# Patient Record
Sex: Female | Born: 1950 | Race: White | Hispanic: No | Smoking: Never smoker
Health system: Southern US, Community
[De-identification: ages and names within clinical notes are randomized; demographics above are authoritative.]

## PROBLEM LIST (undated history)

## (undated) DIAGNOSIS — E7800 Pure hypercholesterolemia, unspecified: Secondary | ICD-10-CM

## (undated) DIAGNOSIS — G2581 Restless legs syndrome: Secondary | ICD-10-CM

## (undated) HISTORY — PX: NECK EXPLORATION: SHX2077

## (undated) HISTORY — DX: Pure hypercholesterolemia, unspecified: E78.00

---

## 2018-04-13 ENCOUNTER — Emergency Department (HOSPITAL_COMMUNITY): Payer: BLUE CROSS/BLUE SHIELD

## 2018-04-13 ENCOUNTER — Emergency Department (HOSPITAL_COMMUNITY)
Admission: EM | Admit: 2018-04-13 | Discharge: 2018-04-13 | Disposition: A | Discharge status: Home / Self Care | Payer: BLUE CROSS/BLUE SHIELD | Attending: Emergency Medicine | Admitting: Emergency Medicine

## 2018-04-13 ENCOUNTER — Encounter (HOSPITAL_COMMUNITY): Payer: Self-pay

## 2018-04-13 ENCOUNTER — Other Ambulatory Visit: Payer: Self-pay

## 2018-04-13 DIAGNOSIS — Y9389 Activity, other specified: Secondary | ICD-10-CM | POA: Diagnosis not present

## 2018-04-13 DIAGNOSIS — Y998 Other external cause status: Secondary | ICD-10-CM | POA: Diagnosis not present

## 2018-04-13 DIAGNOSIS — W25XXXA Contact with sharp glass, initial encounter: Secondary | ICD-10-CM | POA: Insufficient documentation

## 2018-04-13 DIAGNOSIS — S61411A Laceration without foreign body of right hand, initial encounter: Secondary | ICD-10-CM | POA: Diagnosis present

## 2018-04-13 DIAGNOSIS — Y9289 Other specified places as the place of occurrence of the external cause: Secondary | ICD-10-CM | POA: Diagnosis not present

## 2018-04-13 IMAGING — CR DG HAND COMPLETE 3+V*R*
3 series · 3 of 3 positions shown · non-contrast
Comparison: None.

CLINICAL DATA: The patient suffered a laceration of the right hand
proximal to the little finger while trying to glue a plate together
today. Initial encounter.

EXAM:
RIGHT HAND - COMPLETE 3+ VIEW

[x hand pa right]
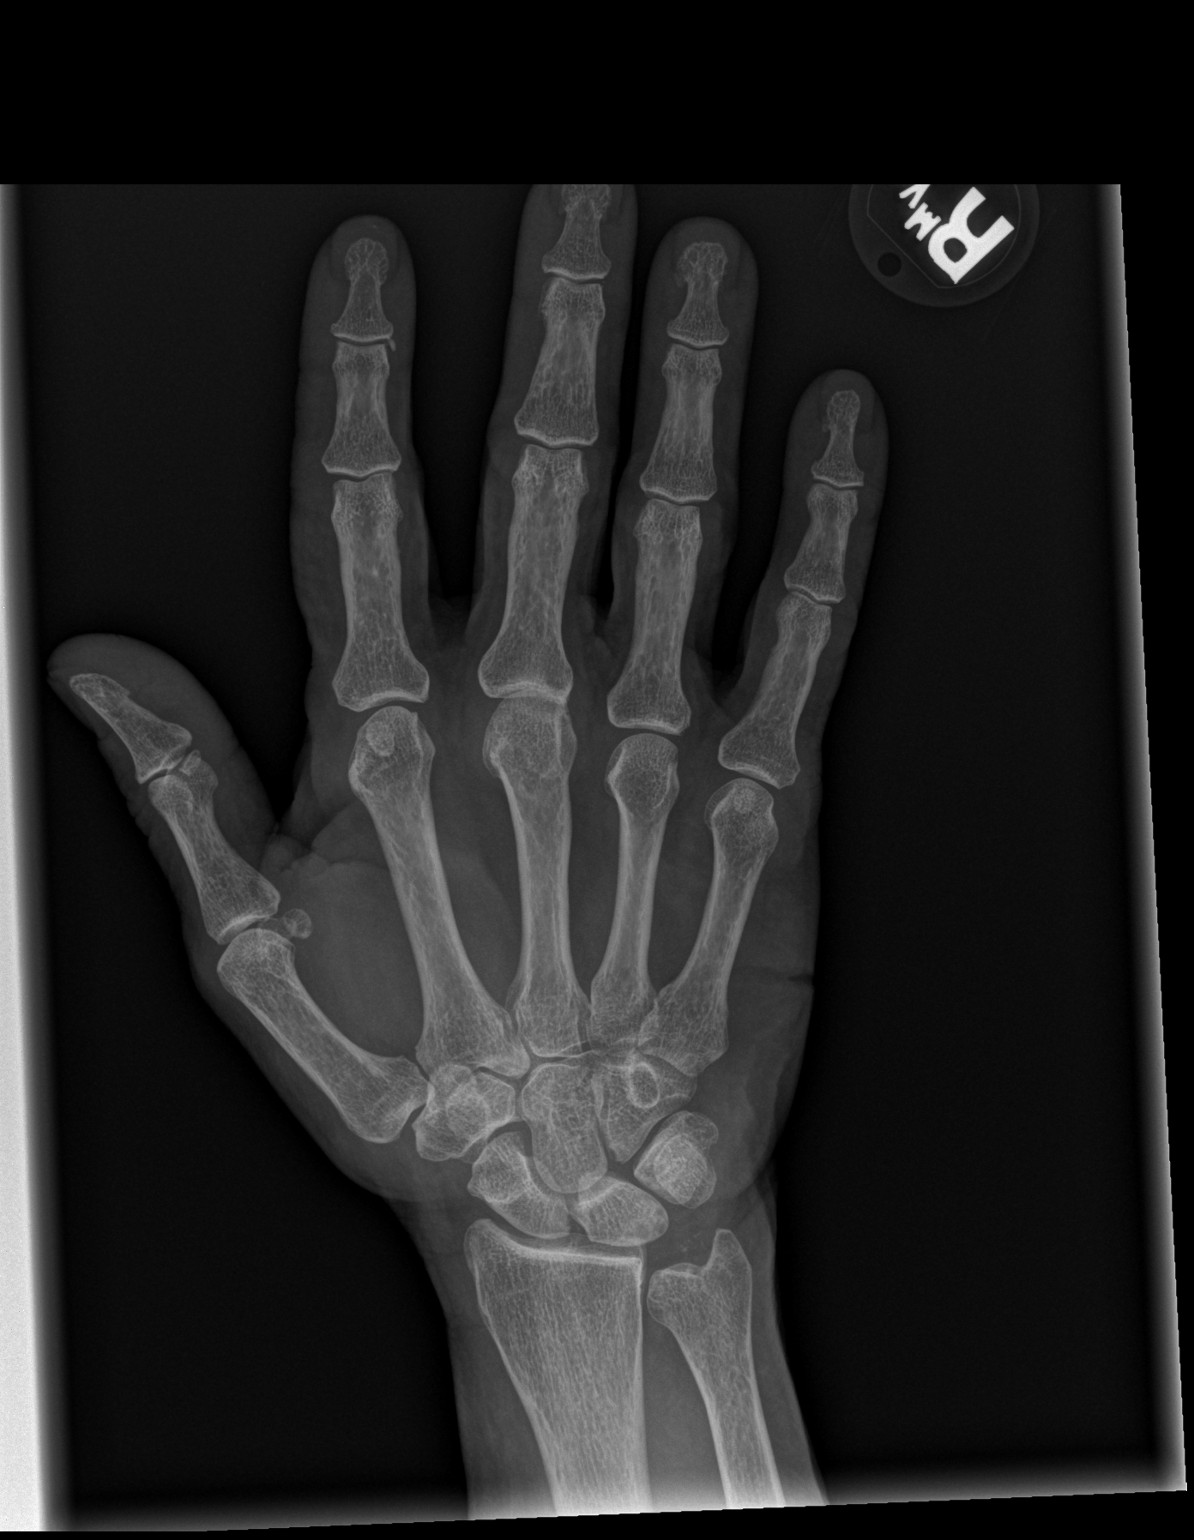

[x hand obl right]
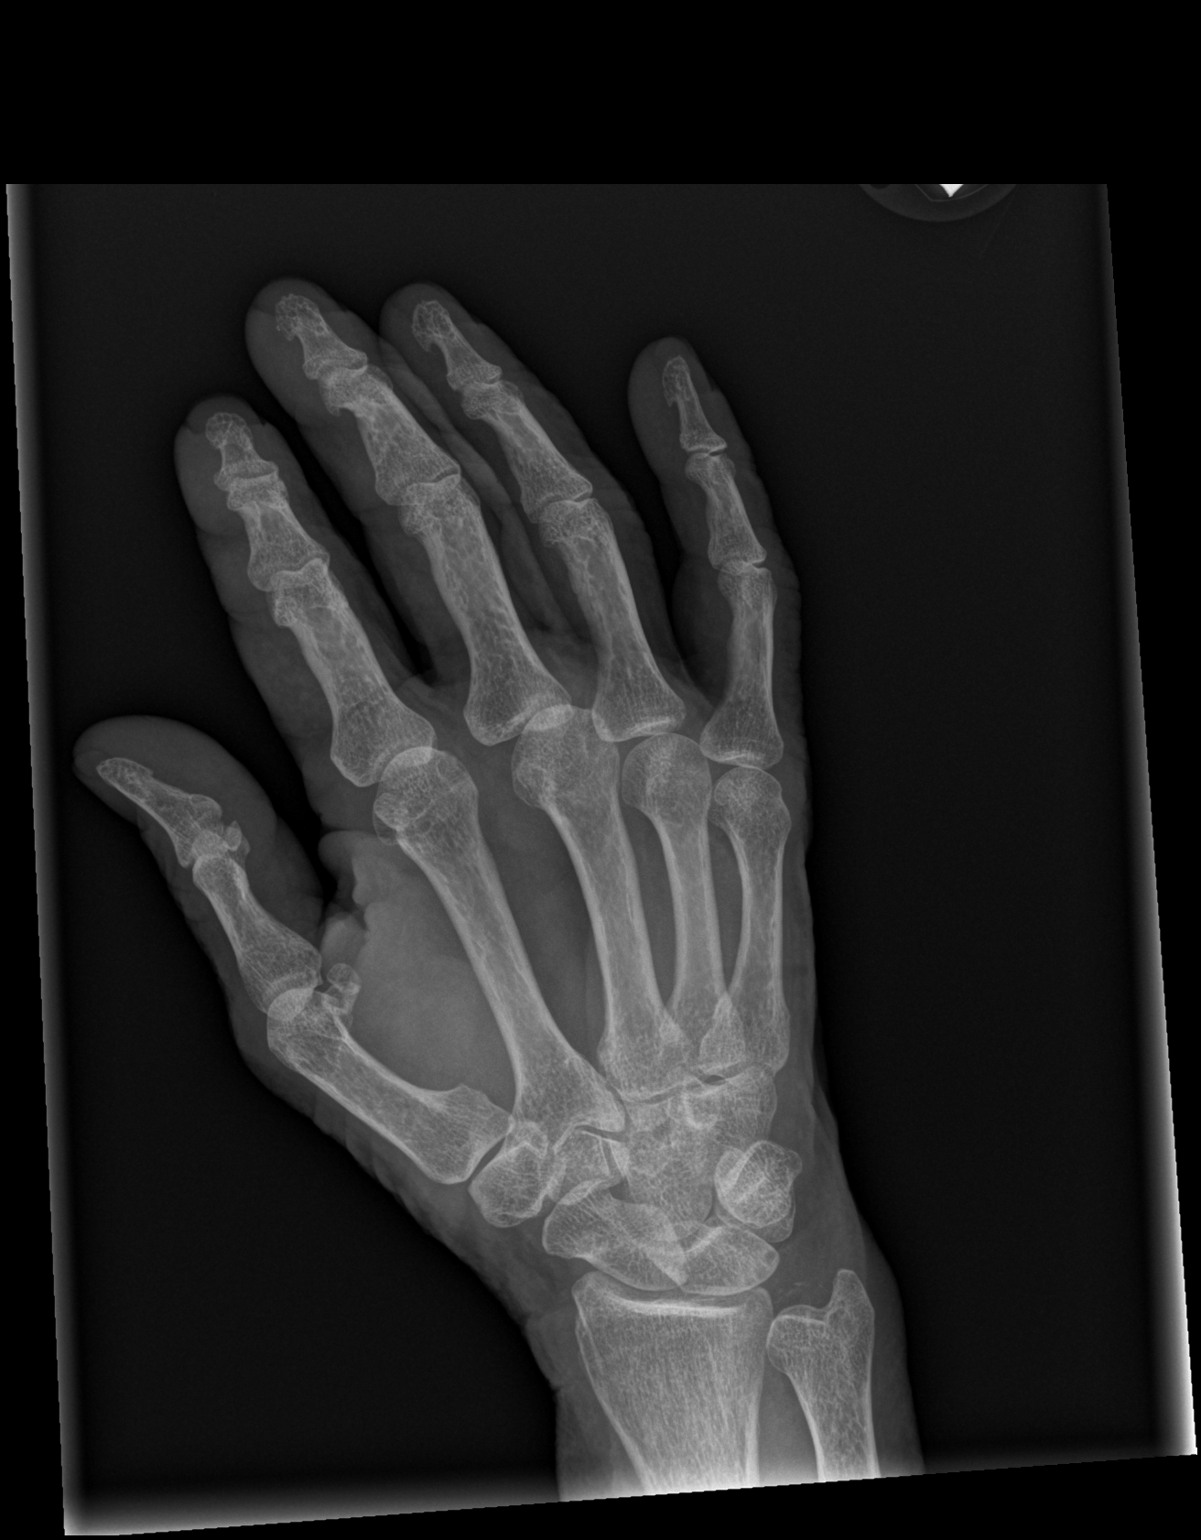

[x hand lat right]
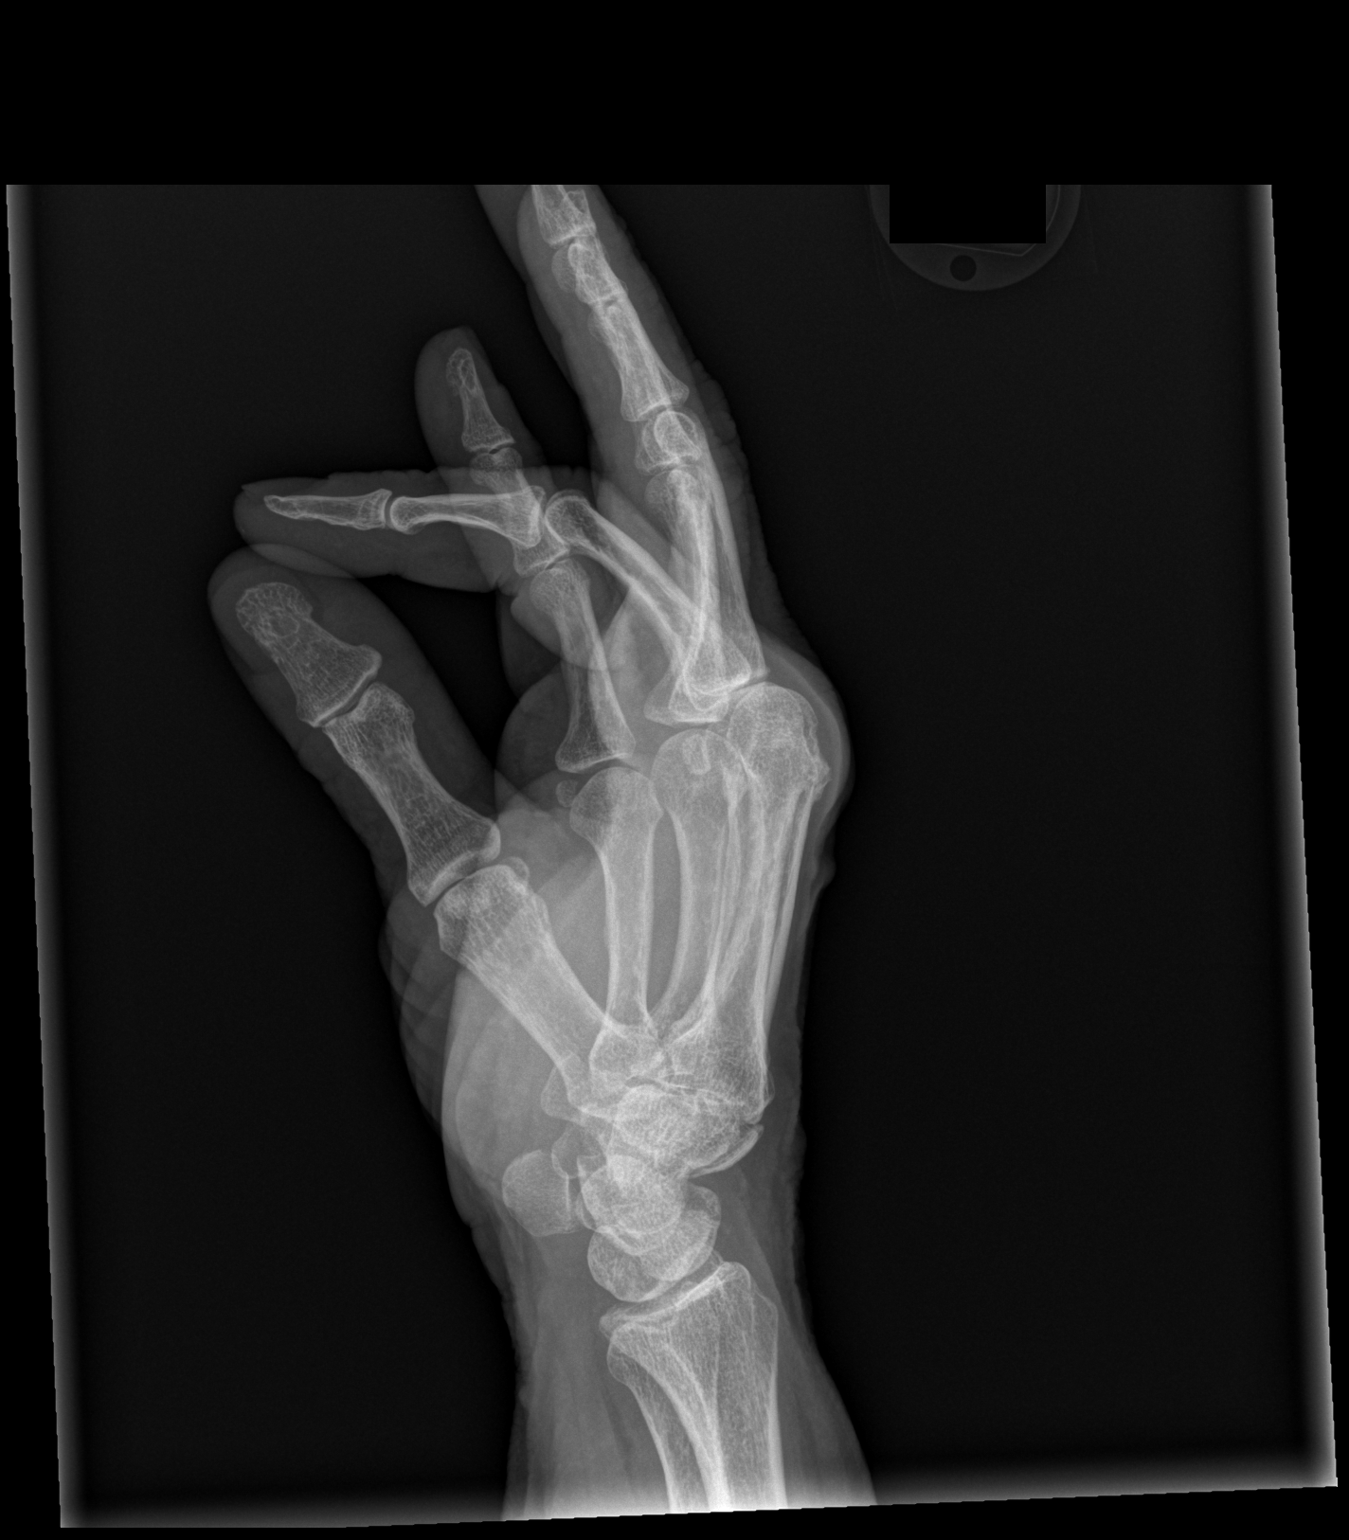

[3 of 3 positions shown; findings below may reference images not displayed]

FINDINGS: Skin defect along the medial margin of the diaphysis of the fifth
metacarpal is compatible with laceration. No fracture or foreign
body. No evidence of arthropathy. Mild chondrocalcinosis noted.
IMPRESSION: Laceration without underlying fracture or foreign body.

## 2018-04-13 MED ORDER — BACITRACIN ZINC 500 UNIT/GM EX OINT
TOPICAL_OINTMENT | Freq: Once | CUTANEOUS | Status: AC
  Administered 2018-04-13: 1 via TOPICAL
  Filled 2018-04-13: qty 1.8

## 2018-04-13 MED ORDER — CEPHALEXIN 500 MG PO CAPS: 500.0000 mg | ORAL_CAPSULE | Freq: Four times a day (QID) | ORAL | 0 refills | Status: AC

## 2018-04-13 MED ORDER — LIDOCAINE HCL (PF) 1 % IJ SOLN
10.0000 mL | Freq: Once | INTRAMUSCULAR | Status: AC
  Administered 2018-04-13: 10 mL via INTRADERMAL
  Filled 2018-04-13: qty 30

## 2018-04-13 NOTE — ED Triage Notes (Signed)
Patient states she was trying to glue a plate back together and cut the palm of her right hand. Bleeding controlled.

## 2018-04-13 NOTE — ED Provider Notes (Signed)
[facility] DEPT Provider Note   CSN: [id number] Arrival date & time: 04/13/18  1637     History   Chief Complaint Chief Complaint  Patient presents with  . Extremity Laceration    HPI Bonnie Robinson is a 67 y.o. female who presents for evaluation of laceration to the palm of her right hand that occurred about 1 hour prior to arrival.  She states she was trying to clean the plate and the plate broke, slicing the lateral aspect of her right palm.  She states her last tetanus was about a year ago.  She is not currently on blood thinners.  She denies any numbness/weakness.  The history is provided by the patient.    History reviewed. No pertinent past medical history.  There are no active problems to display for this patient.   History reviewed. No pertinent surgical history.   OB History   None      Home Medications    Prior to Admission medications   Medication Sig Start Date End Date Taking? Authorizing Provider  cephALEXin (KEFLEX) 500 MG capsule Take 1 capsule (500 mg total) by mouth 4 (four) times daily for 7 days. 04/13/18 04/20/18  Volanda Napoleon, PA-C    Family History Family History  Problem Relation Age of Onset  . Cancer Mother   . Heart failure Father     Social History Social History   Tobacco Use  . Smoking status: Never Smoker  . Smokeless tobacco: Never Used  Substance Use Topics  . Alcohol use: Yes    Comment: occasionally  . Drug use: Never     Allergies   Sulfa antibiotics   Review of Systems Review of Systems  Skin: Positive for wound.  Neurological: Negative for weakness and numbness.  All other systems reviewed and are negative.    Physical Exam Updated Vital Signs BP (!) 145/95 (BP Location: Left Arm)   Pulse 70   Temp 98.7 F (37.1 C) (Oral)   Resp 16   Ht 5\' 3"  (1.6 m)   Wt 74.8 kg   SpO2 100%   BMI 29.23 kg/m   Physical Exam  Constitutional: She appears well-developed and  well-nourished.  HENT:  Head: Normocephalic and atraumatic.  Eyes: Conjunctivae and EOM are normal. Right eye exhibits no discharge. Left eye exhibits no discharge. No scleral icterus.  Cardiovascular:  Pulses:      Radial pulses are 2+ on the right side, and 2+ on the left side.  Pulmonary/Chest: Effort normal.  Musculoskeletal:  No tenderness palpation noted to the carpal bones of the right hand.  No deformity or crepitus noted.  She has full flexion/extension of all 5 digits intact any difficulty.  She can easily make a fist.  Full flexion-extension of left pinky intact without any difficulty.  Neurological: She is alert.  Skin: Skin is warm and dry. Capillary refill takes less than 2 seconds.  Good distal cap refill.  RUE is not dusky in appearance or cool to touch. 3 cm curvilinear laceration noted to the mid hyperthenar eminence of the right palm.  Psychiatric: She has a normal mood and affect. Her speech is normal and behavior is normal.  Nursing note and vitals reviewed.    ED Treatments / Results  Labs (all labs ordered are listed, but only abnormal results are displayed) Labs Reviewed - No data to display  EKG None  Radiology Dg Hand Complete Right  Result Date: 04/13/2018 CLINICAL DATA:  The  patient suffered a laceration of the right hand proximal to the little finger while trying to glue a plate together today. Initial encounter. EXAM: RIGHT HAND - COMPLETE 3+ VIEW COMPARISON:  None. FINDINGS: Skin defect along the medial margin of the diaphysis of the fifth metacarpal is compatible with laceration. No fracture or foreign body. No evidence of arthropathy. Mild chondrocalcinosis noted. IMPRESSION: Laceration without underlying fracture or foreign body. Electronically Signed   By: Inge Rise M.D.   On: 04/13/2018 18:06    Procedures .Marland KitchenLaceration Repair Date/Time: 04/13/2018 5:23 PM Performed by: Volanda Napoleon, PA-C Authorized by: Volanda Napoleon, PA-C    Consent:    Consent obtained:  Verbal   Consent given by:  Patient   Risks discussed:  Infection, need for additional repair, pain, poor cosmetic result and poor wound healing   Alternatives discussed:  No treatment and delayed treatment Universal protocol:    Procedure explained and questions answered to patient or proxy's satisfaction: yes     Relevant documents present and verified: yes     Test results available and properly labeled: yes     Imaging studies available: yes     Required blood products, implants, devices, and special equipment available: yes     Site/side marked: yes     Immediately prior to procedure, a time out was called: yes     Patient identity confirmed:  Verbally with patient Anesthesia (see MAR for exact dosages):    Anesthesia method:  Local infiltration   Local anesthetic:  Lidocaine 1% w/o epi Laceration details:    Location:  Hand   Hand location:  R palm   Length (cm):  3 Repair type:    Repair type:  Simple Pre-procedure details:    Preparation:  Patient was prepped and draped in usual sterile fashion Exploration:    Hemostasis achieved with:  Direct pressure   Wound exploration: wound explored through full range of motion     Wound extent: no foreign bodies/material noted and no tendon damage noted     Contaminated: no   Treatment:    Area cleansed with:  Betadine   Amount of cleaning:  Extensive   Irrigation solution:  Sterile saline   Irrigation method:  Syringe Mucous membrane repair:    Suture size:  5-0   Suture material:  Vicryl   Suture technique:  Simple interrupted   Number of sutures:  1 Skin repair:    Repair method:  Sutures   Suture size:  4-0   Suture material:  Nylon   Suture technique:  Simple interrupted   Number of sutures:  7 Approximation:    Approximation:  Close Post-procedure details:    Dressing:  Antibiotic ointment and non-adherent dressing Comments:     Once the wound was anesthetized, it was thoroughly  and extensively explored.  No evidence of tendon injury.  She is able to fully flex and extend the right fifth digit without any difficulty.  She did have what looked like a small area of pulsatile bleeding from a small capillary.  This area easily stop bleeding with direct pressure.  The wound was thoroughly and extensively explored with no evidence of foreign body.  It was thoroughly and extensively irrigated with sterile saline.   (including critical care time)  Medications Ordered in ED Medications  lidocaine (PF) (XYLOCAINE) 1 % injection 10 mL (10 mLs Intradermal Given 04/13/18 1800)  bacitracin ointment (1 application Topical Given 04/13/18 1908)     Initial  Impression / Assessment and Plan / ED Course  I have reviewed the triage vital signs and the nursing notes.  Pertinent labs & imaging results that were available during my care of the patient were reviewed by me and considered in my medical decision making (see chart for details).     67 year old 61-year-old female who presents for evaluation of right hand laceration that occurred while she was washing dishes.  Her tetanus is up-to-date. Patient is afebrile, non-toxic appearing, sitting comfortably on examination table. Vital signs reviewed and stable.  Patient is neurovascularly intact.  On exam, she has a 3 similar curvilinear linear laceration that extends from the volar aspect of the hyperthenar eminence around the lateral aspect.  She has full flexion/extension of all fingers without any difficulty.  Do not suspect tendon injury.  Will obtain x-ray for evaluation of any foreign body versus fracture.  X-ray reviewed.  Negative for any fracture or foreign body.  Laceration repaired as documented above.  Patient tolerated procedure without any difficulty.  Encouraged at home supportive care measures. Patient had ample opportunity for questions and discussion. All patient's questions were answered with full understanding. Strict  return precautions discussed. Patient expresses understanding and agreement to plan.   Final Clinical Impressions(s) / ED Diagnoses   Final diagnoses:  Laceration of right hand without foreign body, initial encounter    ED Discharge Orders         Ordered    cephALEXin (KEFLEX) 500 MG capsule  4 times daily     04/13/18 1850           Desma Mcgregor 04/13/18 2110    Quintella Reichert, MD 04/14/18 0003

## 2018-04-13 NOTE — Discharge Instructions (Addendum)
Keep the wound clean and dry for the first 24 hours. After that you may gently clean the wound with soap and water. Make sure to pat dry the wound before covering it with any dressing. You can use topical antibiotic ointment and bandage. Ice and elevate for pain relief.  ° °You can take Tylenol or Ibuprofen as directed for pain. You can alternate Tylenol and Ibuprofen every 4 hours for additional pain relief.  ° °Return to the Emergency Department, your primary care doctor, or the [facility] in 5-7 days for suture removal.  ° °Monitor closely for any signs of infection. Return to the Emergency Department for any worsening redness/swelling of the area that begins to spread, drainage from the site, worsening pain, fever or any other worsening or concerning symptoms.  ° ° °

## 2018-06-26 DIAGNOSIS — Z1159 Encounter for screening for other viral diseases: Secondary | ICD-10-CM | POA: Diagnosis not present

## 2018-06-26 DIAGNOSIS — Z0000 Encounter for general adult medical examination without abnormal findings: Secondary | ICD-10-CM | POA: Diagnosis not present

## 2018-06-28 DIAGNOSIS — G2581 Restless legs syndrome: Secondary | ICD-10-CM | POA: Diagnosis not present

## 2018-06-28 DIAGNOSIS — Z1159 Encounter for screening for other viral diseases: Secondary | ICD-10-CM | POA: Diagnosis not present

## 2018-06-28 DIAGNOSIS — Z0000 Encounter for general adult medical examination without abnormal findings: Secondary | ICD-10-CM | POA: Diagnosis not present

## 2018-06-28 DIAGNOSIS — Z1211 Encounter for screening for malignant neoplasm of colon: Secondary | ICD-10-CM | POA: Diagnosis not present

## 2018-06-28 DIAGNOSIS — D72819 Decreased white blood cell count, unspecified: Secondary | ICD-10-CM | POA: Diagnosis not present

## 2018-06-28 DIAGNOSIS — E669 Obesity, unspecified: Secondary | ICD-10-CM | POA: Diagnosis not present

## 2018-06-28 DIAGNOSIS — Z23 Encounter for immunization: Secondary | ICD-10-CM | POA: Diagnosis not present

## 2018-06-28 DIAGNOSIS — M8589 Other specified disorders of bone density and structure, multiple sites: Secondary | ICD-10-CM | POA: Diagnosis not present

## 2018-06-28 DIAGNOSIS — E785 Hyperlipidemia, unspecified: Secondary | ICD-10-CM | POA: Diagnosis not present

## 2018-07-06 DIAGNOSIS — Z1239 Encounter for other screening for malignant neoplasm of breast: Secondary | ICD-10-CM | POA: Diagnosis not present

## 2018-07-06 DIAGNOSIS — Z1231 Encounter for screening mammogram for malignant neoplasm of breast: Secondary | ICD-10-CM | POA: Diagnosis not present

## 2018-09-25 DIAGNOSIS — D72819 Decreased white blood cell count, unspecified: Secondary | ICD-10-CM | POA: Diagnosis not present

## 2018-09-25 DIAGNOSIS — E785 Hyperlipidemia, unspecified: Secondary | ICD-10-CM | POA: Diagnosis not present

## 2018-09-27 DIAGNOSIS — E669 Obesity, unspecified: Secondary | ICD-10-CM | POA: Diagnosis not present

## 2018-09-27 DIAGNOSIS — D72819 Decreased white blood cell count, unspecified: Secondary | ICD-10-CM | POA: Diagnosis not present

## 2018-09-27 DIAGNOSIS — E785 Hyperlipidemia, unspecified: Secondary | ICD-10-CM | POA: Diagnosis not present

## 2018-11-13 DIAGNOSIS — Z1211 Encounter for screening for malignant neoplasm of colon: Secondary | ICD-10-CM | POA: Diagnosis not present

## 2018-11-13 DIAGNOSIS — K5730 Diverticulosis of large intestine without perforation or abscess without bleeding: Secondary | ICD-10-CM | POA: Diagnosis not present

## 2018-11-13 DIAGNOSIS — K640 First degree hemorrhoids: Secondary | ICD-10-CM | POA: Diagnosis not present

## 2018-11-13 DIAGNOSIS — Z86010 Personal history of colonic polyps: Secondary | ICD-10-CM | POA: Diagnosis not present

## 2018-11-13 DIAGNOSIS — K648 Other hemorrhoids: Secondary | ICD-10-CM | POA: Diagnosis not present

## 2018-12-24 DIAGNOSIS — Z124 Encounter for screening for malignant neoplasm of cervix: Secondary | ICD-10-CM | POA: Diagnosis not present

## 2018-12-24 DIAGNOSIS — Z01419 Encounter for gynecological examination (general) (routine) without abnormal findings: Secondary | ICD-10-CM | POA: Diagnosis not present

## 2019-07-01 DIAGNOSIS — M25551 Pain in right hip: Secondary | ICD-10-CM | POA: Diagnosis not present

## 2019-07-01 DIAGNOSIS — D649 Anemia, unspecified: Secondary | ICD-10-CM | POA: Diagnosis not present

## 2019-07-01 DIAGNOSIS — Z0000 Encounter for general adult medical examination without abnormal findings: Secondary | ICD-10-CM | POA: Diagnosis not present

## 2019-07-01 DIAGNOSIS — E785 Hyperlipidemia, unspecified: Secondary | ICD-10-CM | POA: Diagnosis not present

## 2019-07-01 DIAGNOSIS — D72819 Decreased white blood cell count, unspecified: Secondary | ICD-10-CM | POA: Diagnosis not present

## 2019-07-01 DIAGNOSIS — G8929 Other chronic pain: Secondary | ICD-10-CM | POA: Diagnosis not present

## 2019-07-01 DIAGNOSIS — Z135 Encounter for screening for eye and ear disorders: Secondary | ICD-10-CM | POA: Diagnosis not present

## 2019-07-01 DIAGNOSIS — G2581 Restless legs syndrome: Secondary | ICD-10-CM | POA: Diagnosis not present

## 2019-07-02 DIAGNOSIS — E538 Deficiency of other specified B group vitamins: Secondary | ICD-10-CM | POA: Diagnosis not present

## 2019-07-03 DIAGNOSIS — E538 Deficiency of other specified B group vitamins: Secondary | ICD-10-CM | POA: Diagnosis not present

## 2019-07-05 DIAGNOSIS — E538 Deficiency of other specified B group vitamins: Secondary | ICD-10-CM | POA: Diagnosis not present

## 2019-07-06 DIAGNOSIS — E538 Deficiency of other specified B group vitamins: Secondary | ICD-10-CM | POA: Diagnosis not present

## 2019-07-07 DIAGNOSIS — E538 Deficiency of other specified B group vitamins: Secondary | ICD-10-CM | POA: Insufficient documentation

## 2019-07-07 HISTORY — DX: Deficiency of other specified B group vitamins: E53.8

## 2019-07-08 DIAGNOSIS — E538 Deficiency of other specified B group vitamins: Secondary | ICD-10-CM | POA: Diagnosis not present

## 2019-07-09 DIAGNOSIS — E538 Deficiency of other specified B group vitamins: Secondary | ICD-10-CM | POA: Diagnosis not present

## 2019-07-15 DIAGNOSIS — Z83511 Family history of glaucoma: Secondary | ICD-10-CM | POA: Diagnosis not present

## 2019-07-15 DIAGNOSIS — H2513 Age-related nuclear cataract, bilateral: Secondary | ICD-10-CM | POA: Diagnosis not present

## 2019-07-15 DIAGNOSIS — H5203 Hypermetropia, bilateral: Secondary | ICD-10-CM | POA: Diagnosis not present

## 2019-07-15 DIAGNOSIS — H52203 Unspecified astigmatism, bilateral: Secondary | ICD-10-CM | POA: Diagnosis not present

## 2019-07-15 DIAGNOSIS — H524 Presbyopia: Secondary | ICD-10-CM | POA: Diagnosis not present

## 2019-07-15 DIAGNOSIS — H43393 Other vitreous opacities, bilateral: Secondary | ICD-10-CM | POA: Diagnosis not present

## 2019-07-18 DIAGNOSIS — M25551 Pain in right hip: Secondary | ICD-10-CM | POA: Diagnosis not present

## 2019-07-18 DIAGNOSIS — G8929 Other chronic pain: Secondary | ICD-10-CM | POA: Diagnosis not present

## 2019-07-26 DIAGNOSIS — E538 Deficiency of other specified B group vitamins: Secondary | ICD-10-CM | POA: Diagnosis not present

## 2019-07-29 DIAGNOSIS — M6281 Muscle weakness (generalized): Secondary | ICD-10-CM | POA: Diagnosis not present

## 2019-07-29 DIAGNOSIS — R52 Pain, unspecified: Secondary | ICD-10-CM | POA: Diagnosis not present

## 2019-07-29 DIAGNOSIS — M25551 Pain in right hip: Secondary | ICD-10-CM | POA: Diagnosis not present

## 2019-07-29 DIAGNOSIS — Z789 Other specified health status: Secondary | ICD-10-CM | POA: Diagnosis not present

## 2019-07-29 DIAGNOSIS — G8929 Other chronic pain: Secondary | ICD-10-CM | POA: Diagnosis not present

## 2019-08-02 DIAGNOSIS — E538 Deficiency of other specified B group vitamins: Secondary | ICD-10-CM | POA: Diagnosis not present

## 2019-08-27 DIAGNOSIS — Z1231 Encounter for screening mammogram for malignant neoplasm of breast: Secondary | ICD-10-CM | POA: Diagnosis not present

## 2019-09-12 DIAGNOSIS — E538 Deficiency of other specified B group vitamins: Secondary | ICD-10-CM | POA: Diagnosis not present

## 2020-01-07 DIAGNOSIS — D509 Iron deficiency anemia, unspecified: Secondary | ICD-10-CM | POA: Diagnosis not present

## 2020-01-07 DIAGNOSIS — E538 Deficiency of other specified B group vitamins: Secondary | ICD-10-CM | POA: Diagnosis not present

## 2020-01-31 DIAGNOSIS — D229 Melanocytic nevi, unspecified: Secondary | ICD-10-CM | POA: Diagnosis not present

## 2020-01-31 DIAGNOSIS — L821 Other seborrheic keratosis: Secondary | ICD-10-CM | POA: Diagnosis not present

## 2020-01-31 DIAGNOSIS — L814 Other melanin hyperpigmentation: Secondary | ICD-10-CM | POA: Diagnosis not present

## 2020-01-31 DIAGNOSIS — R239 Unspecified skin changes: Secondary | ICD-10-CM | POA: Diagnosis not present

## 2020-02-03 DIAGNOSIS — Z01419 Encounter for gynecological examination (general) (routine) without abnormal findings: Secondary | ICD-10-CM | POA: Diagnosis not present

## 2020-02-03 DIAGNOSIS — Z23 Encounter for immunization: Secondary | ICD-10-CM | POA: Diagnosis not present

## 2020-02-03 DIAGNOSIS — Z8041 Family history of malignant neoplasm of ovary: Secondary | ICD-10-CM

## 2020-02-03 DIAGNOSIS — Z1371 Encounter for nonprocreative screening for genetic disease carrier status: Secondary | ICD-10-CM | POA: Insufficient documentation

## 2020-02-03 HISTORY — DX: Encounter for nonprocreative screening for genetic disease carrier status: Z13.71

## 2020-02-03 HISTORY — DX: Family history of malignant neoplasm of ovary: Z80.41

## 2020-04-29 DIAGNOSIS — R519 Headache, unspecified: Secondary | ICD-10-CM | POA: Diagnosis not present

## 2020-04-29 DIAGNOSIS — D72819 Decreased white blood cell count, unspecified: Secondary | ICD-10-CM | POA: Diagnosis not present

## 2020-04-29 DIAGNOSIS — E539 Vitamin B deficiency, unspecified: Secondary | ICD-10-CM | POA: Diagnosis not present

## 2020-04-29 DIAGNOSIS — Z8639 Personal history of other endocrine, nutritional and metabolic disease: Secondary | ICD-10-CM | POA: Insufficient documentation

## 2020-04-29 DIAGNOSIS — E538 Deficiency of other specified B group vitamins: Secondary | ICD-10-CM | POA: Diagnosis not present

## 2020-04-29 DIAGNOSIS — Z79899 Other long term (current) drug therapy: Secondary | ICD-10-CM | POA: Diagnosis not present

## 2020-04-29 DIAGNOSIS — M542 Cervicalgia: Secondary | ICD-10-CM | POA: Diagnosis not present

## 2020-04-29 DIAGNOSIS — M47812 Spondylosis without myelopathy or radiculopathy, cervical region: Secondary | ICD-10-CM | POA: Diagnosis not present

## 2020-04-29 HISTORY — DX: Personal history of other endocrine, nutritional and metabolic disease: Z86.39

## 2020-05-05 DIAGNOSIS — M542 Cervicalgia: Secondary | ICD-10-CM | POA: Insufficient documentation

## 2020-05-05 HISTORY — DX: Cervicalgia: M54.2

## 2020-07-01 DIAGNOSIS — D509 Iron deficiency anemia, unspecified: Secondary | ICD-10-CM | POA: Diagnosis not present

## 2020-07-01 DIAGNOSIS — E785 Hyperlipidemia, unspecified: Secondary | ICD-10-CM | POA: Diagnosis not present

## 2020-07-02 DIAGNOSIS — R519 Headache, unspecified: Secondary | ICD-10-CM | POA: Diagnosis not present

## 2020-07-02 DIAGNOSIS — Z9189 Other specified personal risk factors, not elsewhere classified: Secondary | ICD-10-CM | POA: Diagnosis not present

## 2020-07-02 DIAGNOSIS — M542 Cervicalgia: Secondary | ICD-10-CM | POA: Diagnosis not present

## 2020-07-02 DIAGNOSIS — G2581 Restless legs syndrome: Secondary | ICD-10-CM | POA: Diagnosis not present

## 2020-07-02 DIAGNOSIS — Z13820 Encounter for screening for osteoporosis: Secondary | ICD-10-CM | POA: Diagnosis not present

## 2020-07-02 DIAGNOSIS — E785 Hyperlipidemia, unspecified: Secondary | ICD-10-CM | POA: Diagnosis not present

## 2020-07-02 DIAGNOSIS — Z0000 Encounter for general adult medical examination without abnormal findings: Secondary | ICD-10-CM | POA: Diagnosis not present

## 2020-07-02 DIAGNOSIS — Z23 Encounter for immunization: Secondary | ICD-10-CM | POA: Diagnosis not present

## 2020-07-02 DIAGNOSIS — D509 Iron deficiency anemia, unspecified: Secondary | ICD-10-CM | POA: Diagnosis not present

## 2020-07-02 DIAGNOSIS — R030 Elevated blood-pressure reading, without diagnosis of hypertension: Secondary | ICD-10-CM | POA: Diagnosis not present

## 2020-07-02 DIAGNOSIS — D72819 Decreased white blood cell count, unspecified: Secondary | ICD-10-CM | POA: Diagnosis not present

## 2020-07-06 DIAGNOSIS — Z780 Asymptomatic menopausal state: Secondary | ICD-10-CM | POA: Diagnosis not present

## 2020-07-06 DIAGNOSIS — M85832 Other specified disorders of bone density and structure, left forearm: Secondary | ICD-10-CM | POA: Diagnosis not present

## 2020-07-06 DIAGNOSIS — M810 Age-related osteoporosis without current pathological fracture: Secondary | ICD-10-CM | POA: Diagnosis not present

## 2020-07-13 DIAGNOSIS — M810 Age-related osteoporosis without current pathological fracture: Secondary | ICD-10-CM | POA: Diagnosis not present

## 2020-07-20 DIAGNOSIS — H353131 Nonexudative age-related macular degeneration, bilateral, early dry stage: Secondary | ICD-10-CM | POA: Diagnosis not present

## 2020-07-20 DIAGNOSIS — H524 Presbyopia: Secondary | ICD-10-CM | POA: Diagnosis not present

## 2020-07-20 DIAGNOSIS — H2513 Age-related nuclear cataract, bilateral: Secondary | ICD-10-CM | POA: Diagnosis not present

## 2020-07-20 DIAGNOSIS — H43393 Other vitreous opacities, bilateral: Secondary | ICD-10-CM | POA: Diagnosis not present

## 2020-07-20 DIAGNOSIS — H35363 Drusen (degenerative) of macula, bilateral: Secondary | ICD-10-CM | POA: Diagnosis not present

## 2020-07-20 DIAGNOSIS — M810 Age-related osteoporosis without current pathological fracture: Secondary | ICD-10-CM | POA: Diagnosis not present

## 2020-07-20 DIAGNOSIS — H52203 Unspecified astigmatism, bilateral: Secondary | ICD-10-CM | POA: Diagnosis not present

## 2020-07-20 DIAGNOSIS — H5203 Hypermetropia, bilateral: Secondary | ICD-10-CM | POA: Diagnosis not present

## 2020-07-21 DIAGNOSIS — R29898 Other symptoms and signs involving the musculoskeletal system: Secondary | ICD-10-CM | POA: Diagnosis not present

## 2020-07-21 DIAGNOSIS — R293 Abnormal posture: Secondary | ICD-10-CM | POA: Diagnosis not present

## 2020-07-21 DIAGNOSIS — R519 Headache, unspecified: Secondary | ICD-10-CM | POA: Diagnosis not present

## 2020-07-21 DIAGNOSIS — M6289 Other specified disorders of muscle: Secondary | ICD-10-CM | POA: Diagnosis not present

## 2020-07-21 DIAGNOSIS — M542 Cervicalgia: Secondary | ICD-10-CM | POA: Diagnosis not present

## 2020-08-18 DIAGNOSIS — R519 Headache, unspecified: Secondary | ICD-10-CM | POA: Diagnosis not present

## 2020-08-18 DIAGNOSIS — M542 Cervicalgia: Secondary | ICD-10-CM | POA: Diagnosis not present

## 2020-08-18 DIAGNOSIS — R29898 Other symptoms and signs involving the musculoskeletal system: Secondary | ICD-10-CM | POA: Diagnosis not present

## 2020-08-18 DIAGNOSIS — M6289 Other specified disorders of muscle: Secondary | ICD-10-CM | POA: Diagnosis not present

## 2020-08-18 DIAGNOSIS — R293 Abnormal posture: Secondary | ICD-10-CM | POA: Diagnosis not present

## 2020-08-31 DIAGNOSIS — R293 Abnormal posture: Secondary | ICD-10-CM | POA: Diagnosis not present

## 2020-08-31 DIAGNOSIS — M542 Cervicalgia: Secondary | ICD-10-CM | POA: Diagnosis not present

## 2020-08-31 DIAGNOSIS — M6289 Other specified disorders of muscle: Secondary | ICD-10-CM | POA: Diagnosis not present

## 2020-08-31 DIAGNOSIS — R29898 Other symptoms and signs involving the musculoskeletal system: Secondary | ICD-10-CM | POA: Diagnosis not present

## 2020-08-31 DIAGNOSIS — R519 Headache, unspecified: Secondary | ICD-10-CM | POA: Diagnosis not present

## 2020-09-16 DIAGNOSIS — E785 Hyperlipidemia, unspecified: Secondary | ICD-10-CM | POA: Diagnosis not present

## 2020-09-16 DIAGNOSIS — Z9189 Other specified personal risk factors, not elsewhere classified: Secondary | ICD-10-CM | POA: Diagnosis not present

## 2020-09-17 DIAGNOSIS — Z1231 Encounter for screening mammogram for malignant neoplasm of breast: Secondary | ICD-10-CM | POA: Diagnosis not present

## 2020-09-21 DIAGNOSIS — E785 Hyperlipidemia, unspecified: Secondary | ICD-10-CM | POA: Diagnosis not present

## 2020-09-21 DIAGNOSIS — D72819 Decreased white blood cell count, unspecified: Secondary | ICD-10-CM | POA: Diagnosis not present

## 2020-09-21 DIAGNOSIS — R030 Elevated blood-pressure reading, without diagnosis of hypertension: Secondary | ICD-10-CM | POA: Diagnosis not present

## 2020-09-21 DIAGNOSIS — R519 Headache, unspecified: Secondary | ICD-10-CM | POA: Diagnosis not present

## 2020-09-21 DIAGNOSIS — M542 Cervicalgia: Secondary | ICD-10-CM | POA: Diagnosis not present

## 2020-09-28 DIAGNOSIS — M1712 Unilateral primary osteoarthritis, left knee: Secondary | ICD-10-CM | POA: Diagnosis not present

## 2020-10-20 DIAGNOSIS — R519 Headache, unspecified: Secondary | ICD-10-CM | POA: Diagnosis not present

## 2020-10-20 DIAGNOSIS — M47812 Spondylosis without myelopathy or radiculopathy, cervical region: Secondary | ICD-10-CM | POA: Diagnosis not present

## 2020-10-20 DIAGNOSIS — M4802 Spinal stenosis, cervical region: Secondary | ICD-10-CM | POA: Diagnosis not present

## 2020-10-20 DIAGNOSIS — M542 Cervicalgia: Secondary | ICD-10-CM | POA: Diagnosis not present

## 2020-10-21 DIAGNOSIS — M5030 Other cervical disc degeneration, unspecified cervical region: Secondary | ICD-10-CM | POA: Diagnosis not present

## 2020-10-21 DIAGNOSIS — M47812 Spondylosis without myelopathy or radiculopathy, cervical region: Secondary | ICD-10-CM | POA: Diagnosis not present

## 2020-10-21 DIAGNOSIS — M542 Cervicalgia: Secondary | ICD-10-CM | POA: Diagnosis not present

## 2020-11-02 DIAGNOSIS — M542 Cervicalgia: Secondary | ICD-10-CM | POA: Diagnosis not present

## 2020-11-02 DIAGNOSIS — Z882 Allergy status to sulfonamides status: Secondary | ICD-10-CM | POA: Diagnosis not present

## 2020-11-02 DIAGNOSIS — M1990 Unspecified osteoarthritis, unspecified site: Secondary | ICD-10-CM | POA: Diagnosis not present

## 2020-11-02 DIAGNOSIS — M5481 Occipital neuralgia: Secondary | ICD-10-CM | POA: Diagnosis not present

## 2020-11-02 DIAGNOSIS — M47812 Spondylosis without myelopathy or radiculopathy, cervical region: Secondary | ICD-10-CM | POA: Diagnosis not present

## 2020-11-02 DIAGNOSIS — R519 Headache, unspecified: Secondary | ICD-10-CM | POA: Diagnosis not present

## 2020-11-02 DIAGNOSIS — Z79899 Other long term (current) drug therapy: Secondary | ICD-10-CM | POA: Diagnosis not present

## 2020-11-02 DIAGNOSIS — M154 Erosive (osteo)arthritis: Secondary | ICD-10-CM | POA: Diagnosis not present

## 2020-11-02 HISTORY — DX: Erosive (osteo)arthritis: M15.4

## 2020-11-02 HISTORY — DX: Occipital neuralgia: M54.81

## 2020-11-12 DIAGNOSIS — M47812 Spondylosis without myelopathy or radiculopathy, cervical region: Secondary | ICD-10-CM | POA: Diagnosis not present

## 2020-11-12 DIAGNOSIS — M479 Spondylosis, unspecified: Secondary | ICD-10-CM | POA: Diagnosis not present

## 2020-12-01 DIAGNOSIS — M47812 Spondylosis without myelopathy or radiculopathy, cervical region: Secondary | ICD-10-CM | POA: Diagnosis not present

## 2020-12-17 DIAGNOSIS — M47812 Spondylosis without myelopathy or radiculopathy, cervical region: Secondary | ICD-10-CM | POA: Diagnosis not present

## 2020-12-17 HISTORY — DX: Spondylosis without myelopathy or radiculopathy, cervical region: M47.812

## 2021-01-06 DIAGNOSIS — M47812 Spondylosis without myelopathy or radiculopathy, cervical region: Secondary | ICD-10-CM | POA: Diagnosis not present

## 2021-02-03 DIAGNOSIS — Z79899 Other long term (current) drug therapy: Secondary | ICD-10-CM | POA: Diagnosis not present

## 2021-02-03 DIAGNOSIS — M810 Age-related osteoporosis without current pathological fracture: Secondary | ICD-10-CM

## 2021-02-03 DIAGNOSIS — M47812 Spondylosis without myelopathy or radiculopathy, cervical region: Secondary | ICD-10-CM | POA: Diagnosis not present

## 2021-02-03 DIAGNOSIS — R29898 Other symptoms and signs involving the musculoskeletal system: Secondary | ICD-10-CM

## 2021-02-03 DIAGNOSIS — M542 Cervicalgia: Secondary | ICD-10-CM | POA: Diagnosis not present

## 2021-02-03 DIAGNOSIS — M5481 Occipital neuralgia: Secondary | ICD-10-CM | POA: Diagnosis not present

## 2021-02-03 DIAGNOSIS — R531 Weakness: Secondary | ICD-10-CM | POA: Diagnosis not present

## 2021-02-03 HISTORY — DX: Age-related osteoporosis without current pathological fracture: M81.0

## 2021-02-03 HISTORY — DX: Spondylosis without myelopathy or radiculopathy, cervical region: M47.812

## 2021-02-03 HISTORY — DX: Other symptoms and signs involving the musculoskeletal system: R29.898

## 2021-02-22 DIAGNOSIS — M47812 Spondylosis without myelopathy or radiculopathy, cervical region: Secondary | ICD-10-CM | POA: Diagnosis not present

## 2021-02-22 DIAGNOSIS — Z79899 Other long term (current) drug therapy: Secondary | ICD-10-CM | POA: Diagnosis not present

## 2021-02-23 DIAGNOSIS — M810 Age-related osteoporosis without current pathological fracture: Secondary | ICD-10-CM | POA: Diagnosis not present

## 2021-02-23 DIAGNOSIS — Z7189 Other specified counseling: Secondary | ICD-10-CM | POA: Diagnosis not present

## 2021-08-23 ENCOUNTER — Other Ambulatory Visit: Payer: Self-pay

## 2021-08-23 ENCOUNTER — Encounter (HOSPITAL_BASED_OUTPATIENT_CLINIC_OR_DEPARTMENT_OTHER): Payer: Self-pay | Admitting: Emergency Medicine

## 2021-08-23 ENCOUNTER — Inpatient Hospital Stay (HOSPITAL_BASED_OUTPATIENT_CLINIC_OR_DEPARTMENT_OTHER)
Admission: EM | Admit: 2021-08-23 | Discharge: 2021-08-29 | DRG: 455 | Disposition: A | Discharge status: Home / Self Care | Payer: PPO | Attending: Neurological Surgery | Admitting: Neurological Surgery

## 2021-08-23 ENCOUNTER — Inpatient Hospital Stay (HOSPITAL_COMMUNITY): Payer: PPO

## 2021-08-23 ENCOUNTER — Emergency Department (HOSPITAL_COMMUNITY): Payer: PPO

## 2021-08-23 DIAGNOSIS — M4316 Spondylolisthesis, lumbar region: Secondary | ICD-10-CM | POA: Diagnosis not present

## 2021-08-23 DIAGNOSIS — Z7952 Long term (current) use of systemic steroids: Secondary | ICD-10-CM | POA: Diagnosis not present

## 2021-08-23 DIAGNOSIS — M810 Age-related osteoporosis without current pathological fracture: Secondary | ICD-10-CM | POA: Diagnosis present

## 2021-08-23 DIAGNOSIS — R29898 Other symptoms and signs involving the musculoskeletal system: Secondary | ICD-10-CM

## 2021-08-23 DIAGNOSIS — M7138 Other bursal cyst, other site: Secondary | ICD-10-CM | POA: Diagnosis present

## 2021-08-23 DIAGNOSIS — Z882 Allergy status to sulfonamides status: Secondary | ICD-10-CM

## 2021-08-23 DIAGNOSIS — M48061 Spinal stenosis, lumbar region without neurogenic claudication: Secondary | ICD-10-CM | POA: Diagnosis present

## 2021-08-23 DIAGNOSIS — M4726 Other spondylosis with radiculopathy, lumbar region: Secondary | ICD-10-CM | POA: Diagnosis present

## 2021-08-23 DIAGNOSIS — M5431 Sciatica, right side: Secondary | ICD-10-CM | POA: Diagnosis present

## 2021-08-23 DIAGNOSIS — Z981 Arthrodesis status: Secondary | ICD-10-CM

## 2021-08-23 DIAGNOSIS — M5416 Radiculopathy, lumbar region: Secondary | ICD-10-CM | POA: Diagnosis not present

## 2021-08-23 DIAGNOSIS — M4802 Spinal stenosis, cervical region: Secondary | ICD-10-CM | POA: Diagnosis present

## 2021-08-23 DIAGNOSIS — Z79899 Other long term (current) drug therapy: Secondary | ICD-10-CM

## 2021-08-23 DIAGNOSIS — M5432 Sciatica, left side: Principal | ICD-10-CM

## 2021-08-23 HISTORY — DX: Spondylolisthesis, lumbar region: M43.16

## 2021-08-23 IMAGING — MR MR LUMBAR SPINE W/O CM
4 of 5 series · 17 of 48 positions shown · non-contrast
Comparison: Lumbar spine radiographs [DATE]

CLINICAL DATA: Low back pain radiating down both legs

EXAM:
MRI LUMBAR SPINE WITHOUT CONTRAST
TECHNIQUE: Multiplanar, multisequence MR imaging of the lumbar spine was
performed. No intravenous contrast was administered.

[Series 2: T2 · sagittal · 4.0mm · 0.55mm/px · 6 of 18 slices shown (1 of 2)]
[im 1/18]
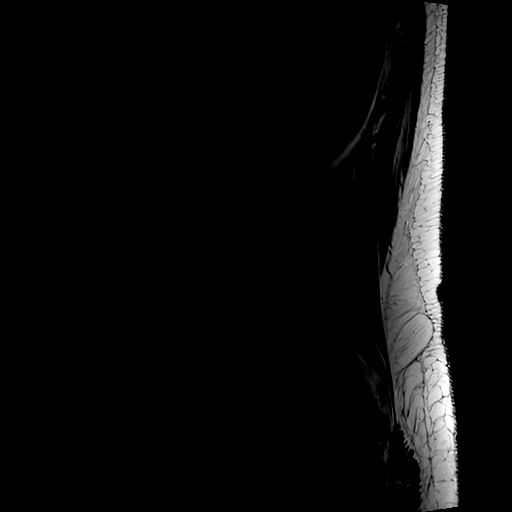
[im 4/18]
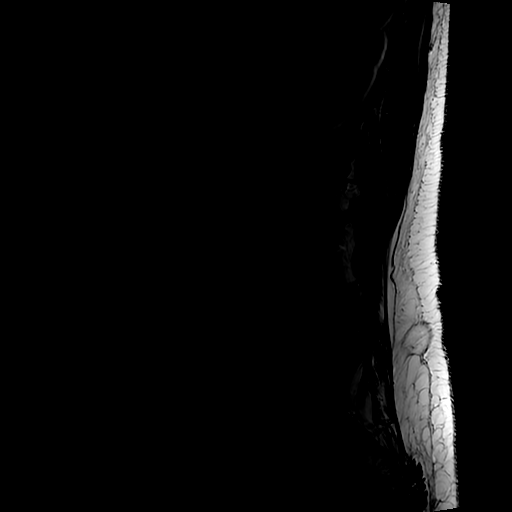
[im 7/18]
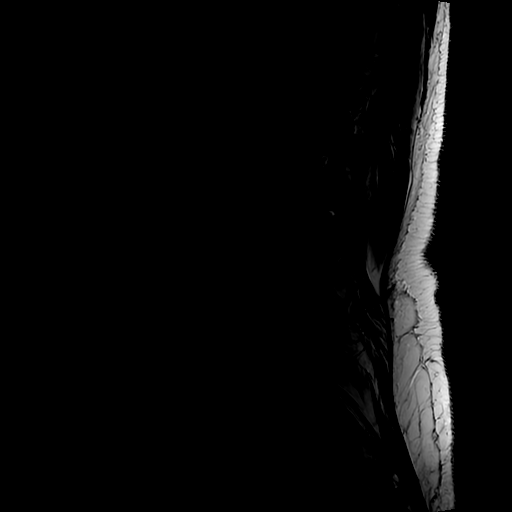
[im 11/18]
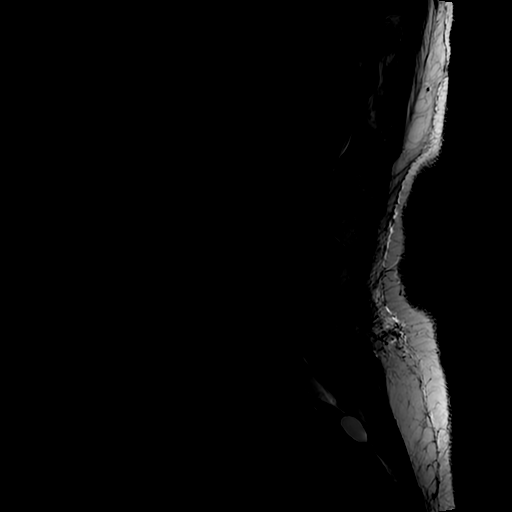
[im 14/18]
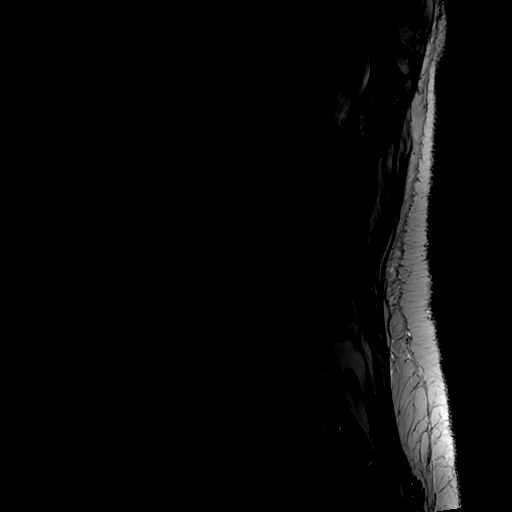
[im 18/18]
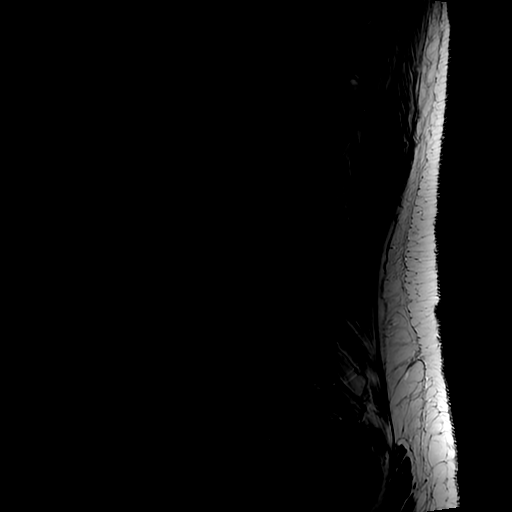

[Series 4: T1 · sagittal · 4.0mm · 0.55mm/px · 3 of 18 slices shown (1 of 2)]
[im 3/18]
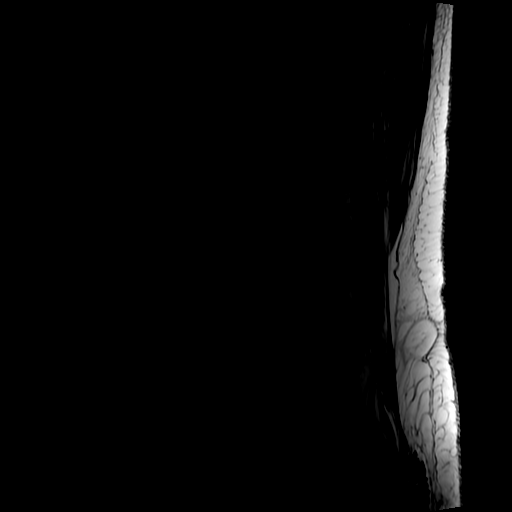
[im 9/18]
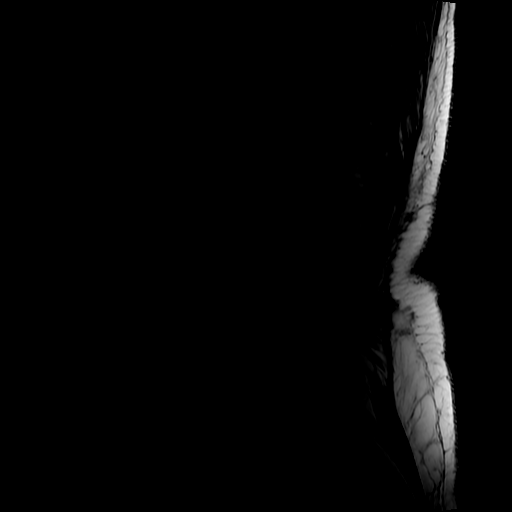
[im 15/18]
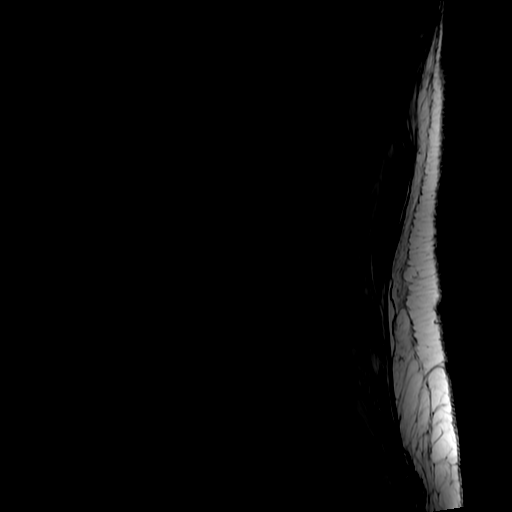

[Series 5: T2 · axial · 4.0mm · 0.39mm/px · z∈[-27,+168]mm · 5 of 38 slices shown (2 of 2)]
[im 1/38]
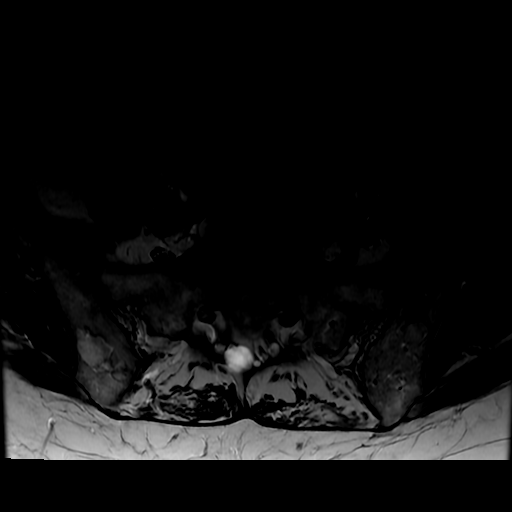
[im 6/38]
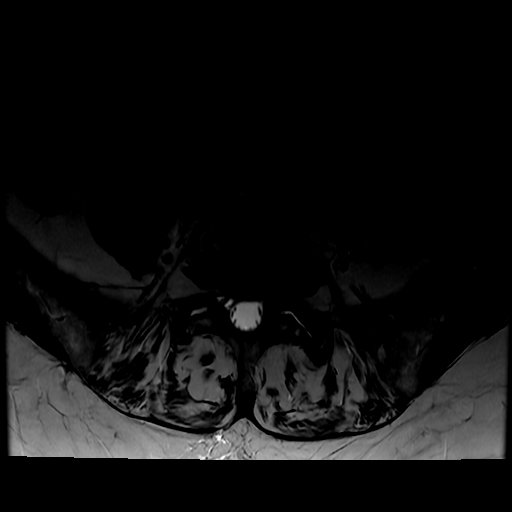
[im 12/38]
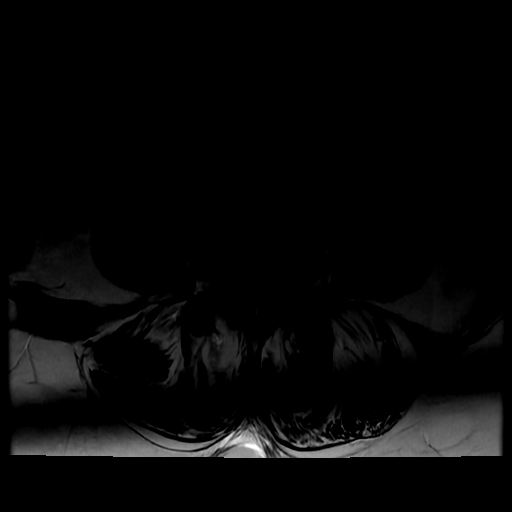
[im 20/38]
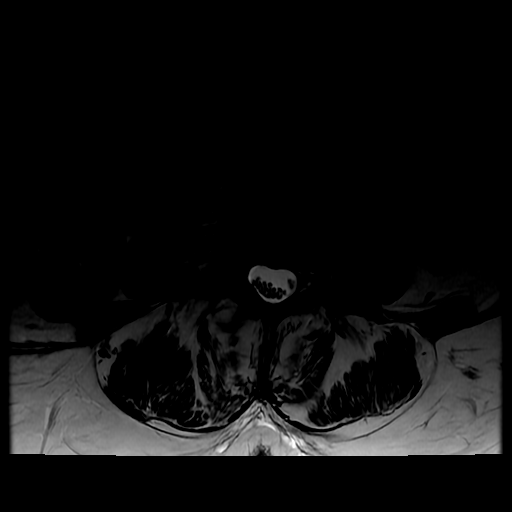
[im 32/38]
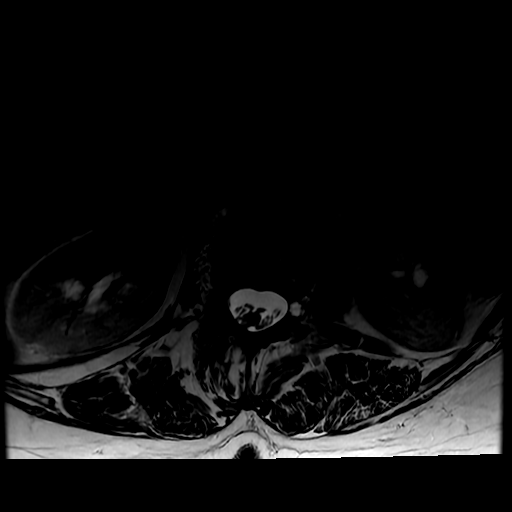

[Series 6: T1 · axial · 4.0mm · 0.39mm/px · z∈[-3,+168]mm · 3 of 38 slices shown (2 of 2)]
[im 6/38]
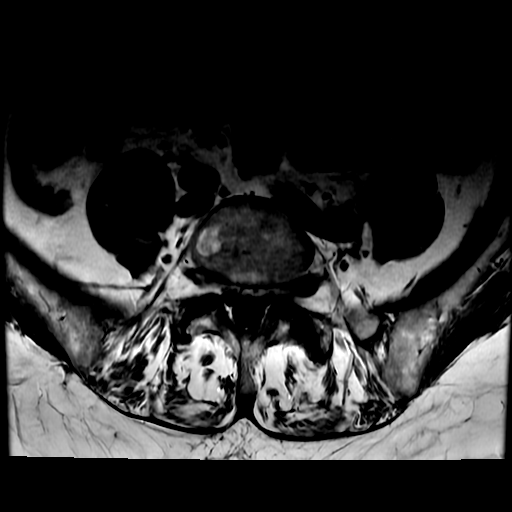
[im 20/38]
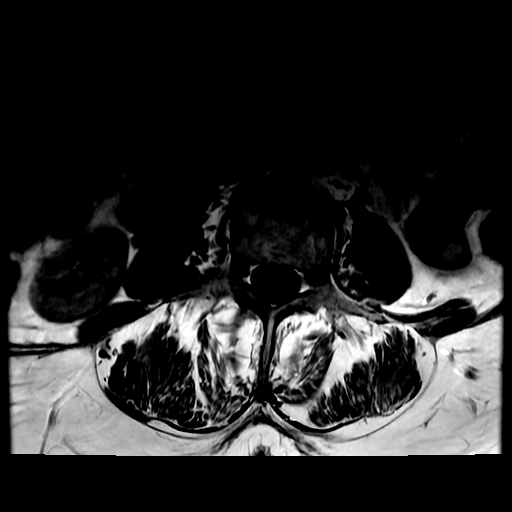
[im 32/38]
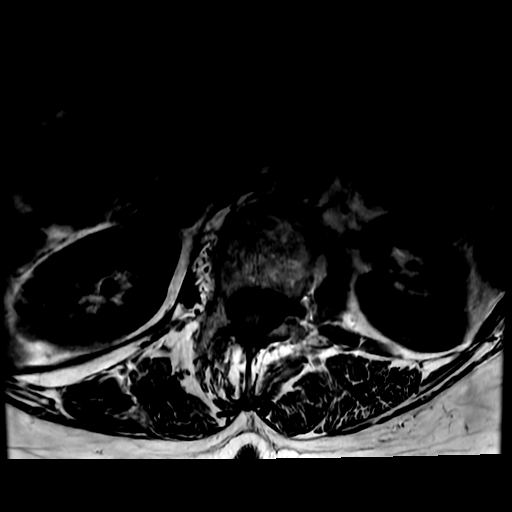

[17 of 48 positions shown; findings below may reference images not displayed]

FINDINGS: Segmentation: Standard; the lowest formed disc space is designated
L5-S1.

Alignment: There is levoscoliosis centered at L3. There is grade 1
anterolisthesis of L4 on L5, likely degenerative in nature.

Vertebrae: Vertebral body heights are preserved. There is mild
degenerative endplate marrow signal abnormality at L2-L3. There is
no suspicious signal abnormality. There is no marrow edema.

Conus medullaris and cauda equina: Conus extends to the L1-L2 level.
Conus and cauda equina appear normal.

Paraspinal and other soft tissues: T2 hyperintense renal lesions
likely reflects cysts, for which no specific imaging follow-up is
required. The paraspinal soft tissues are unremarkable.

Disc levels:

There is multilevel disc desiccation and narrowing, most advanced at
L2-L3. There is multilevel facet arthropathy, most advanced at L4-L5
and L5-S1

T12-L1: There is a mild disc bulge and mild bilateral facet
arthropathy without significant spinal canal or neural foraminal
stenosis

L1-L2: There is a mild disc bulge and right worse than left facet
arthropathy resulting in mild narrowing of the right subarticular
zone without evidence of nerve root impingement without significant
neural foraminal stenosis

L2-L3: There is a mild disc bulge and right worse than left facet
arthropathy resulting in narrowing of the right subarticular zone
with possible irritation of the traversing L3 nerve root and mild
right worse than left neural foraminal stenosis.

L3-L4: There is a diffuse disc bulge and bilateral facet arthropathy
resulting in mild spinal canal stenosis with narrowing of the
subarticular zones, left worse than right, and mild bilateral neural
foraminal stenosis

L4-L5: There is grade 1 anterolisthesis with uncovering of the disc
posteriorly and a superimposed mild bulge and severe bilateral facet
arthropathy resulting in severe spinal canal stenosis with
impingement of the cauda equina nerve roots and mild-to-moderate
left and mild right neural foraminal stenosis

L5-S1: There is a mild disc bulge and moderate left worse than right
facet arthropathy with trace effusions resulting in narrowing of the
left subarticular zone with potential irritation of the traversing
S1 nerve root and mild-to-moderate left and no significant right
neural foraminal stenosis.
IMPRESSION: 1. Grade 1 anterolisthesis of L4 on L5 with associated advanced
facet arthropathy and disc bulge resulting in severe spinal canal
stenosis with impingement of the cauda equina nerve roots and
mild-to-moderate left and mild right neural foraminal stenosis.
2. Subarticular zone narrowing on the right at L1-L2 and L2-L3,
bilaterally at L3-L4 (left worse than right), and on the left at
L5-S1 with potential irritation of the traversing nerve roots. There
is also mild-to-moderate left neural foraminal stenosis at L5-S1.
3. Levoscoliosis centered at L3.

## 2021-08-23 IMAGING — CR DG LUMBAR SPINE 2-3V
3 series · 3 of 3 positions shown · non-contrast
Comparison: [DATE]

CLINICAL DATA: Low back pain

EXAM:
LUMBAR SPINE - 2-3 VIEW

[l-spine ap]
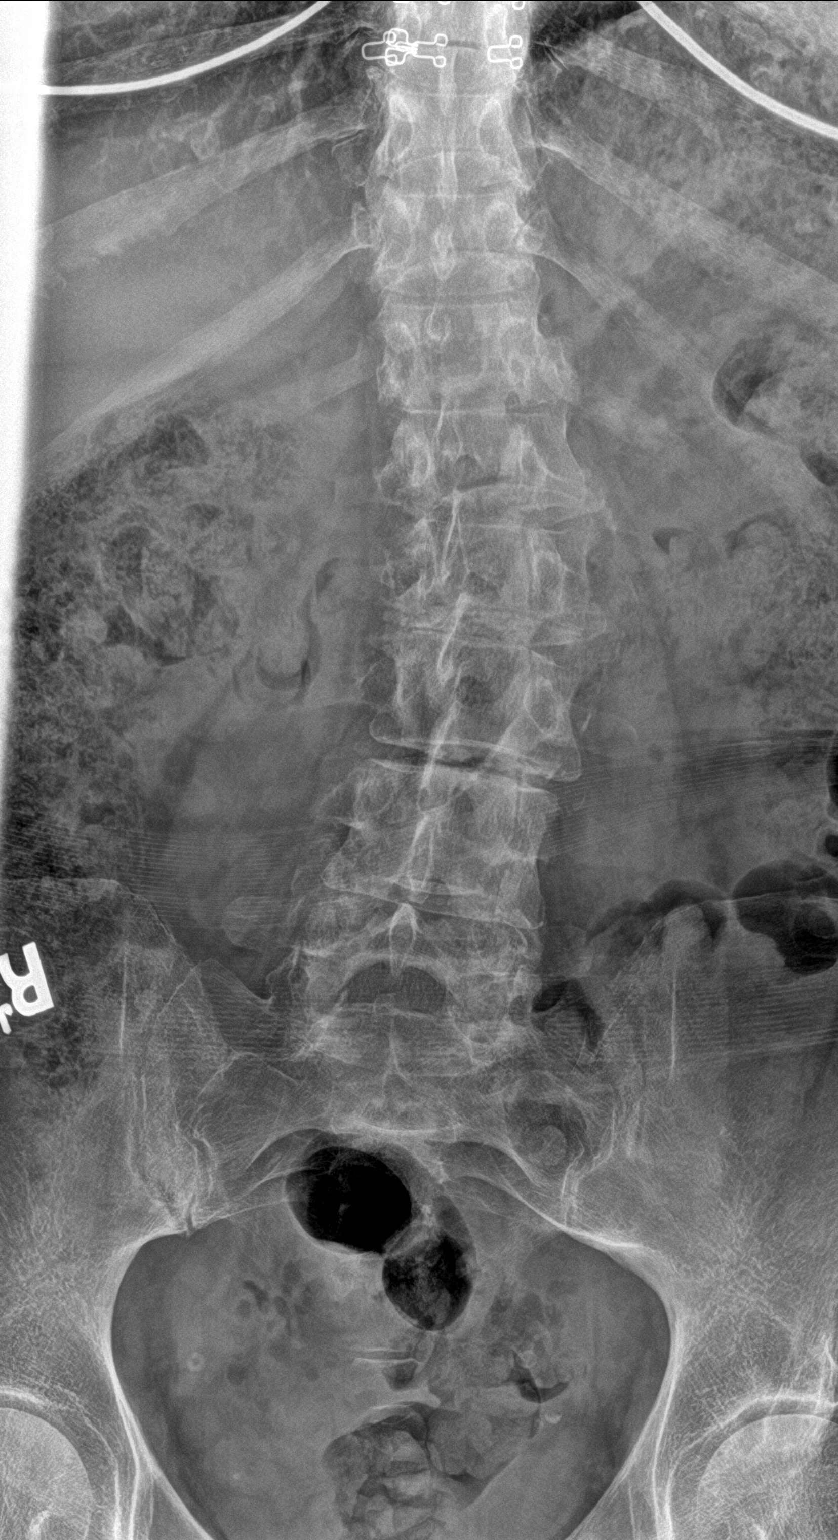

[l-spine lat]
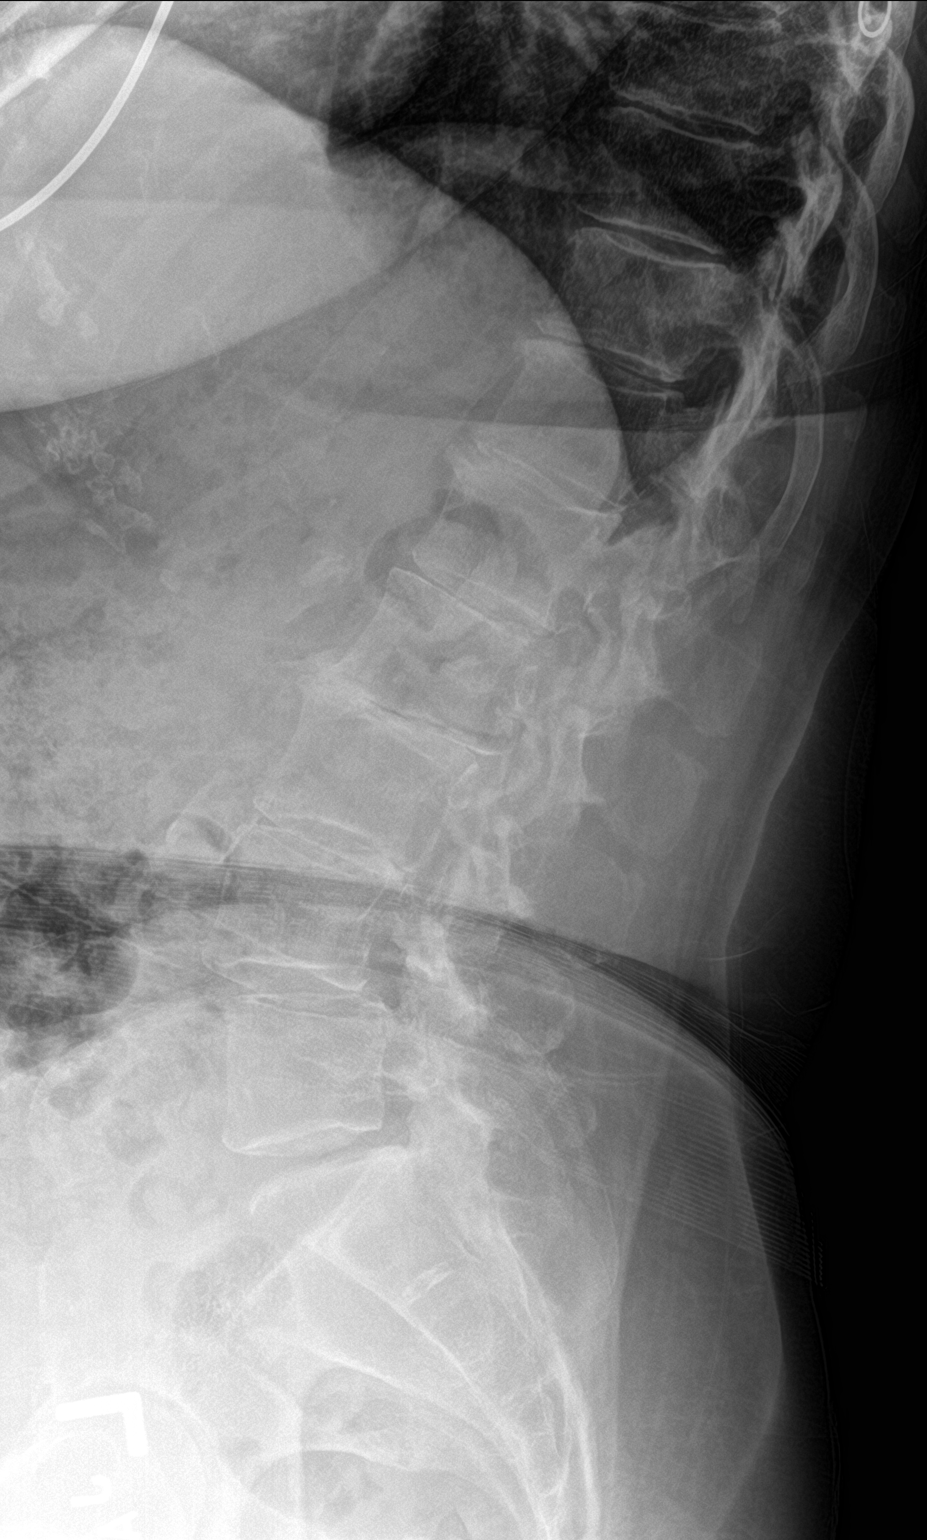

[l-spine spot]
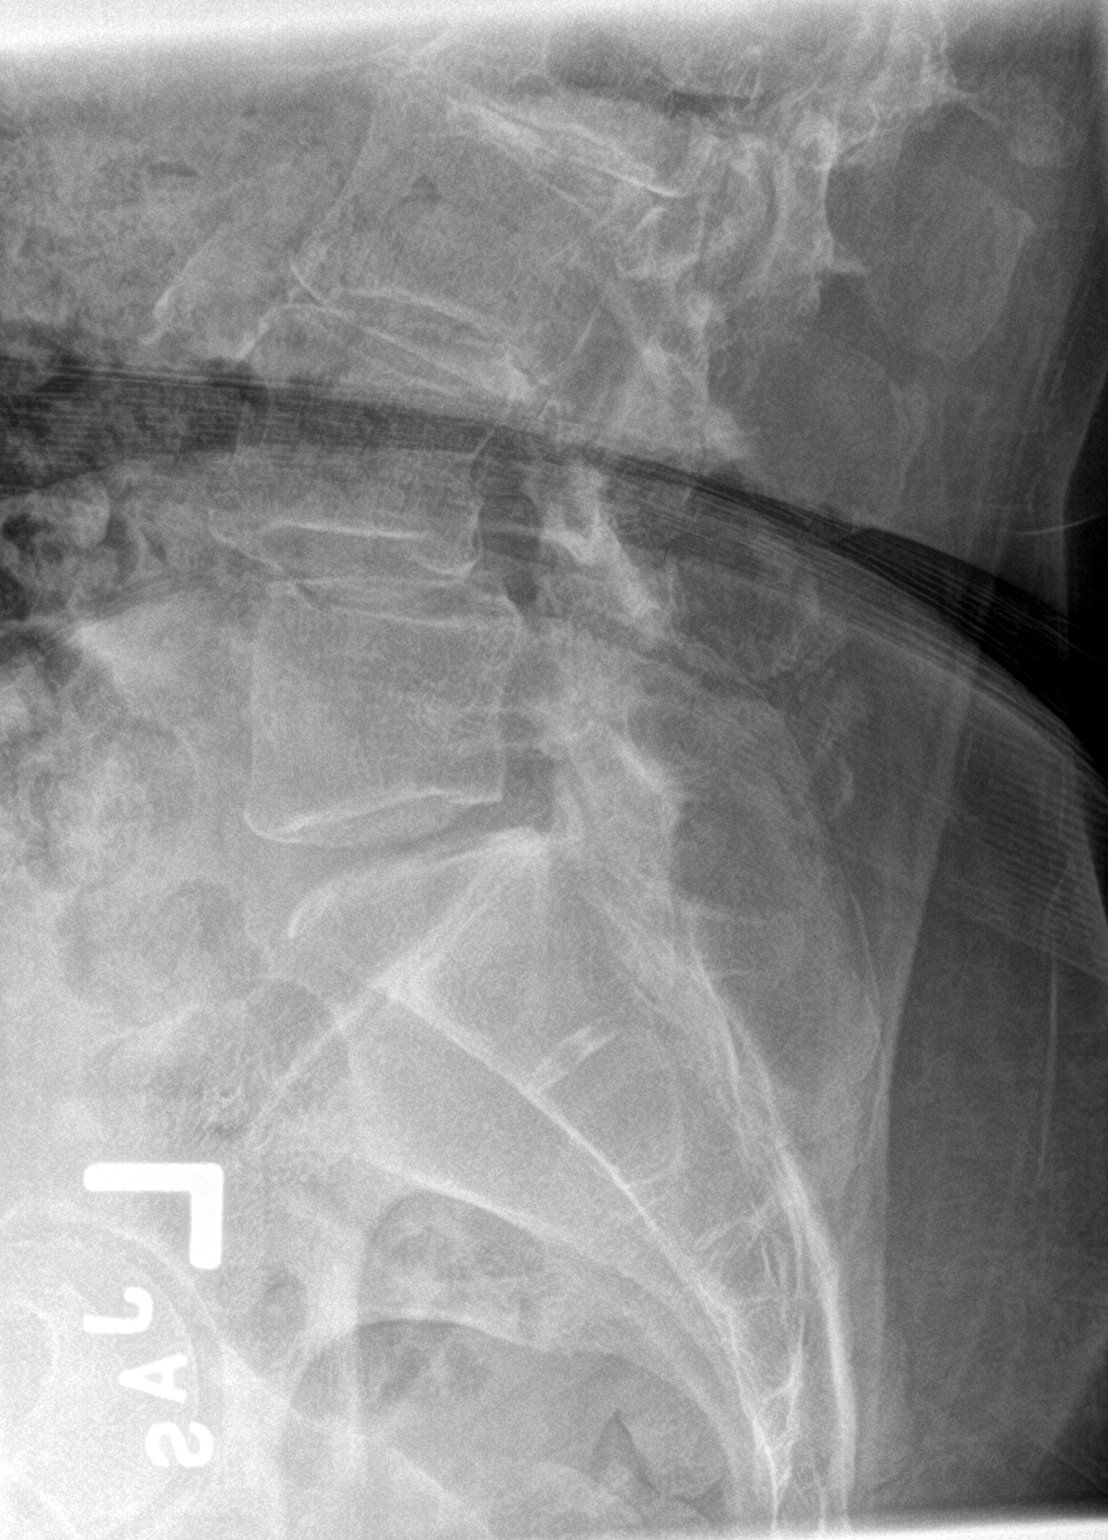

[3 of 3 positions shown; findings below may reference images not displayed]

FINDINGS: Levoscoliosis. Five non rib-bearing lumbar type vertebra. Grade 1
anterolisthesis L4 on L5. Vertebral body heights are maintained.
Moderate disc space narrowing at L1-L2 and L3-L4 with advanced disc
space narrowing and degenerative change L2-L3. Mild degenerative
changes L4 through S1. Facet degenerative changes of the lower
lumbar spine.
IMPRESSION: Scoliosis with degenerative change, most advanced at L2-L3

## 2021-08-23 MED ORDER — ACETAMINOPHEN 325 MG PO TABS
650.0000 mg | ORAL_TABLET | ORAL | Status: DC | PRN
  Administered 2021-08-24: 650 mg via ORAL
  Filled 2021-08-23: qty 2

## 2021-08-23 MED ORDER — HYDROCODONE-ACETAMINOPHEN 5-325 MG PO TABS
1.0000 | ORAL_TABLET | ORAL | Status: DC | PRN
  Administered 2021-08-24 (×2): 1 via ORAL
  Filled 2021-08-23 (×4): qty 1

## 2021-08-23 MED ORDER — POTASSIUM CHLORIDE IN NACL 20-0.9 MEQ/L-% IV SOLN
INTRAVENOUS | Status: DC
  Filled 2021-08-23: qty 1000

## 2021-08-23 MED ORDER — PRAMIPEXOLE DIHYDROCHLORIDE 1.5 MG PO TABS
1.5000 mg | ORAL_TABLET | Freq: Every day | ORAL | Status: DC
  Filled 2021-08-23 (×2): qty 1

## 2021-08-23 MED ORDER — MORPHINE SULFATE (PF) 2 MG/ML IV SOLN: 2.0000 mg | INTRAVENOUS | Status: DC | PRN

## 2021-08-23 MED ORDER — PANTOPRAZOLE SODIUM 40 MG IV SOLR
40.0000 mg | Freq: Every day | INTRAVENOUS | Status: DC
  Administered 2021-08-23 – 2021-08-24 (×2): 40 mg via INTRAVENOUS
  Filled 2021-08-23 (×2): qty 10

## 2021-08-23 MED ORDER — METHOCARBAMOL 1000 MG/10ML IJ SOLN
500.0000 mg | Freq: Four times a day (QID) | INTRAVENOUS | Status: DC | PRN
  Filled 2021-08-23 (×2): qty 5

## 2021-08-23 MED ORDER — METHOCARBAMOL 500 MG PO TABS
500.0000 mg | ORAL_TABLET | Freq: Four times a day (QID) | ORAL | Status: DC | PRN
  Administered 2021-08-23: 500 mg via ORAL
  Filled 2021-08-23: qty 1

## 2021-08-23 MED ORDER — DEXAMETHASONE SODIUM PHOSPHATE 4 MG/ML IJ SOLN
4.0000 mg | Freq: Four times a day (QID) | INTRAMUSCULAR | Status: DC
  Administered 2021-08-23 – 2021-08-27 (×2): 4 mg via INTRAVENOUS
  Filled 2021-08-23 (×2): qty 1

## 2021-08-23 MED ORDER — SODIUM CHLORIDE 0.9 % IV SOLN: 250.0000 mL | INTRAVENOUS | Status: DC

## 2021-08-23 MED ORDER — PREDNISONE 20 MG PO TABS
40.0000 mg | ORAL_TABLET | Freq: Once | ORAL | Status: AC
  Administered 2021-08-23: 40 mg via ORAL
  Filled 2021-08-23: qty 2

## 2021-08-23 MED ORDER — SODIUM CHLORIDE 0.9% FLUSH
3.0000 mL | INTRAVENOUS | Status: DC | PRN
  Administered 2021-08-24: 3 mL via INTRAVENOUS

## 2021-08-23 MED ORDER — SODIUM CHLORIDE 0.9% FLUSH
3.0000 mL | Freq: Two times a day (BID) | INTRAVENOUS | Status: DC
  Administered 2021-08-24 – 2021-08-26 (×5): 3 mL via INTRAVENOUS

## 2021-08-23 MED ORDER — DEXAMETHASONE 4 MG PO TABS
4.0000 mg | ORAL_TABLET | Freq: Four times a day (QID) | ORAL | Status: DC
  Administered 2021-08-24 – 2021-08-26 (×12): 4 mg via ORAL
  Filled 2021-08-23 (×13): qty 1

## 2021-08-23 MED ORDER — ONDANSETRON HCL 4 MG/2ML IJ SOLN: 4.0000 mg | Freq: Four times a day (QID) | INTRAMUSCULAR | Status: DC | PRN

## 2021-08-23 MED ORDER — ACETAMINOPHEN 650 MG RE SUPP: 650.0000 mg | RECTAL | Status: DC | PRN

## 2021-08-23 MED ORDER — SENNA 8.6 MG PO TABS
1.0000 | ORAL_TABLET | Freq: Two times a day (BID) | ORAL | Status: DC
  Administered 2021-08-24 – 2021-08-26 (×4): 8.6 mg via ORAL
  Filled 2021-08-23 (×6): qty 1

## 2021-08-23 MED ORDER — ONDANSETRON HCL 4 MG PO TABS: 4.0000 mg | ORAL_TABLET | Freq: Four times a day (QID) | ORAL | Status: DC | PRN

## 2021-08-23 NOTE — ED Triage Notes (Signed)
Pt arrives pov, to triage in wheelchair c/o bilateral leg pain and weakness x 1 week, progressively worsening. Pt AOx4 ?

## 2021-08-23 NOTE — ED Provider Notes (Addendum)
?[facility] ?Provider Note ? ? ?CSN: [id number] ?Arrival date & time: 08/23/21  2263 ? ?  ? ?History ? ?Chief Complaint  ?Patient presents with  ? MRI/MCHP  ? ? ?Bonnie Robinson is a 71 y.o. female. ? ? ?71 year old female presents today from [facility] with pain, numbness, and weakness of her lower extremities.  She was sent here for an MRI.  MRI is completed patient's symptoms have begun within the past week or so.  Prior to this she was playing tennis and getting her off any difficulty. ?She denies any loss of bowel or bladder control.  He has numbness in her bilateral feet. ?  ? ?Home Medications ?Prior to Admission medications   ?Medication Sig Start Date End Date Taking? Authorizing Provider  ?cyclobenzaprine (FLEXERIL) 5 MG tablet Take 5 mg by mouth 3 (three) times daily as needed. 08/18/21   [provider]  ?gabapentin (NEURONTIN) 300 MG capsule Take by mouth. 08/18/21   [provider]  ?gabapentin (NEURONTIN) 600 MG tablet Take 600 mg by mouth daily. 06/28/21   [provider]  ?pramipexole (MIRAPEX) 1.5 MG tablet Take 1.5-4.5 mg by mouth at bedtime. 08/08/21   [provider]  ?predniSONE (DELTASONE) 20 MG tablet Take 20 mg by mouth 2 (two) times daily. 08/19/21   [provider]  ?rosuvastatin (CRESTOR) 5 MG tablet Take 5 mg by mouth daily. 06/22/21   [provider]  ?   ? ?Allergies    ?Sulfa antibiotics   ? ?Review of Systems   ?Review of Systems ? ?Physical Exam ?Updated Vital Signs ?BP 113/75 (BP Location: Right Arm)   Pulse 89   Temp 98.1 ?F (36.7 ?C)   Resp 17   Ht 1.575 m ('5\' 2"'$ )   Wt 75.3 kg   SpO2 100%   BMI 30.36 kg/m?  ?Physical Exam ?Vitals reviewed.  ?Constitutional:   ?   Appearance: Normal appearance.  ?Neurological:  ?   General: No focal deficit present.  ?   Mental Status: She is alert and oriented to person, place, and time.  ?   Cranial Nerves: No cranial nerve deficit.  ?   Motor: No  weakness.  ?   Gait: Gait abnormal.  ? ? ?ED Results / Procedures / Treatments   ?Labs ?(all labs ordered are listed, but only abnormal results are displayed) ?Labs Reviewed - No data to display ? ?EKG ?None ? ?Radiology ?MR LUMBAR SPINE WO CONTRAST ? ?Result Date: 08/23/2021 ?CLINICAL DATA:  Low back pain radiating down both legs EXAM: MRI LUMBAR SPINE WITHOUT CONTRAST TECHNIQUE: Multiplanar, multisequence MR imaging of the lumbar spine was performed. No intravenous contrast was administered. COMPARISON:  Lumbar spine radiographs 08/19/2021 FINDINGS: Segmentation: Standard; the lowest formed disc space is designated L5-S1. Alignment: There is levoscoliosis centered at L3. There is grade 1 anterolisthesis of L4 on L5, likely degenerative in nature. Vertebrae: Vertebral body heights are preserved. There is mild degenerative endplate marrow signal abnormality at L2-L3. There is no suspicious signal abnormality. There is no marrow edema. Conus medullaris and cauda equina: Conus extends to the L1-L2 level. Conus and cauda equina appear normal. Paraspinal and other soft tissues: T2 hyperintense renal lesions likely reflects cysts, for which no specific imaging follow-up is required. The paraspinal soft tissues are unremarkable. Disc levels: There is multilevel disc desiccation and narrowing, most advanced at L2-L3. There is multilevel facet arthropathy, most advanced at L4-L5 and L5-S1 T12-L1: There is a  mild disc bulge and mild bilateral facet arthropathy without significant spinal canal or neural foraminal stenosis L1-L2: There is a mild disc bulge and right worse than left facet arthropathy resulting in mild narrowing of the right subarticular zone without evidence of nerve root impingement without significant neural foraminal stenosis L2-L3: There is a mild disc bulge and right worse than left facet arthropathy resulting in narrowing of the right subarticular zone with possible irritation of the traversing L3 nerve  root and mild right worse than left neural foraminal stenosis. L3-L4: There is a diffuse disc bulge and bilateral facet arthropathy resulting in mild spinal canal stenosis with narrowing of the subarticular zones, left worse than right, and mild bilateral neural foraminal stenosis L4-L5: There is grade 1 anterolisthesis with uncovering of the disc posteriorly and a superimposed mild bulge and severe bilateral facet arthropathy resulting in severe spinal canal stenosis with impingement of the cauda equina nerve roots and mild-to-moderate left and mild right neural foraminal stenosis L5-S1: There is a mild disc bulge and moderate left worse than right facet arthropathy with trace effusions resulting in narrowing of the left subarticular zone with potential irritation of the traversing S1 nerve root and mild-to-moderate left and no significant right neural foraminal stenosis. IMPRESSION: 1. Grade 1 anterolisthesis of L4 on L5 with associated advanced facet arthropathy and disc bulge resulting in severe spinal canal stenosis with impingement of the cauda equina nerve roots and mild-to-moderate left and mild right neural foraminal stenosis. 2. Subarticular zone narrowing on the right at L1-L2 and L2-L3, bilaterally at L3-L4 (left worse than right), and on the left at L5-S1 with potential irritation of the traversing nerve roots. There is also mild-to-moderate left neural foraminal stenosis at L5-S1. 3. Levoscoliosis centered at L3. Electronically Signed   By: Valetta Mole M.D.   On: 08/23/2021 14:44   ? ?Procedures ?Procedures  ? ? ?Medications Ordered in ED ?Medications  ?predniSONE (DELTASONE) tablet 40 mg (has no administration in time range)  ? ? ?ED Course/ Medical Decision Making/ A&P ?  ?                        ?Medical Decision Making ?Amount and/or Complexity of Data Reviewed ?Radiology: ordered. ?Discussion of management or test interpretation with external provider(s): Neurosurgery consultation  placed ? ?Risk ?Prescription drug management. ?Risk Details: Discussed care with Dr. Ronnald Ramp, neurosurgery who will see and evaluate ?Discussed care with Dr. Melina Copa who will facilitate disposition after Dr. Ronnald Ramp evaluation ? ? ? ?Discussed with Dr. Ronnald Ramp and neurosurgery will see and evaluate in ED ? ? ? ? ? ? ? ?Final Clinical Impression(s) / ED Diagnoses ?Final diagnoses:  ?Bilateral sciatica  ?Weakness of both lower extremities  ? ? ?Rx / DC Orders ?ED Discharge Orders   ? ? None  ? ?  ? ? ?  ?Pattricia Boss, MD ?08/23/21 1509 ? ?  ?Pattricia Boss, MD ?08/23/21 1534 ? ?

## 2021-08-23 NOTE — ED Provider Notes (Signed)
?[facility] ?Provider Note ? ? ?CSN: [id number] ?Arrival date & time: 08/23/21  1324 ? ?  ? ?History ? ?Chief Complaint  ?Patient presents with  ? Extremity Weakness  ? ? ?Bonnie Robinson is a 71 y.o. female. ? ?71 yo F with a chief complaint of low back pain that radiates down to both of her legs.  This been going on for a little bit over a week.  She was playing tennis the day before it occurred.  She denies any overt injury.  She denies loss of bowel or bladder nasals.  Sensation.  She feels like she is a bit unsteady when she tries to get up and walk and feels like she has numbness to bilateral feet.  She had seen her family doctor a few days ago and had been examined and started on some medications.  She was seen again today with some progressive worsening and was sent here for MRI.  She denies fevers.  Denies abdominal pain. ? ?She has been having some left-sided neck discomfort that she is not sure if it was due to her procedure that happened about 5 months ago.  She denies any arm symptoms. ? ? ?Extremity Weakness ? ? ?  ? ?Home Medications ?Prior to Admission medications   ?Medication Sig Start Date End Date Taking? Authorizing Provider  ?cyclobenzaprine (FLEXERIL) 5 MG tablet Take 5 mg by mouth 3 (three) times daily as needed. 08/18/21   [provider]  ?gabapentin (NEURONTIN) 300 MG capsule Take by mouth. 08/18/21   [provider]  ?gabapentin (NEURONTIN) 600 MG tablet Take 600 mg by mouth daily. 06/28/21   [provider]  ?pramipexole (MIRAPEX) 1.5 MG tablet Take 1.5-4.5 mg by mouth at bedtime. 08/08/21   [provider]  ?predniSONE (DELTASONE) 20 MG tablet Take 20 mg by mouth 2 (two) times daily. 08/19/21   [provider]  ?rosuvastatin (CRESTOR) 5 MG tablet Take 5 mg by mouth daily. 06/22/21   [provider]  ?   ? ?Allergies    ?Sulfa antibiotics   ? ?Review of Systems   ?Review of Systems  ?Musculoskeletal:  Positive for  extremity weakness.  ? ?Physical Exam ?Updated Vital Signs ?BP (!) 146/84 (BP Location: Right Arm)   Pulse 78   Temp 98.1 ?F (36.7 ?C)   Resp 15   Ht '5\' 2"'$  (1.575 m)   Wt 75.3 kg   SpO2 99%   BMI 30.36 kg/m?  ?Physical Exam ?Vitals and nursing note reviewed.  ?Constitutional:   ?   General: She is not in acute distress. ?   Appearance: She is well-developed. She is not diaphoretic.  ?HENT:  ?   Head: Normocephalic and atraumatic.  ?Eyes:  ?   Pupils: Pupils are equal, round, and reactive to light.  ?Cardiovascular:  ?   Rate and Rhythm: Normal rate and regular rhythm.  ?   Heart sounds: No murmur heard. ?  No friction rub. No gallop.  ?Pulmonary:  ?   Effort: Pulmonary effort is normal.  ?   Breath sounds: No wheezing or rales.  ?Abdominal:  ?   General: There is no distension.  ?   Palpations: Abdomen is soft.  ?   Tenderness: There is no abdominal tenderness.  ?Musculoskeletal:     ?   General: No tenderness.  ?   Cervical back: Normal range of motion and neck supple.  ?   Comments: Pulse motor and sensation intact  bilateral lower extremities.  Reflexes are 1+ and equal.  No clonus.  ?Skin: ?   General: Skin is warm and dry.  ?Neurological:  ?   Mental Status: She is alert and oriented to person, place, and time.  ?Psychiatric:     ?   Behavior: Behavior normal.  ? ? ?ED Results / Procedures / Treatments   ?Labs ?(all labs ordered are listed, but only abnormal results are displayed) ?Labs Reviewed - No data to display ? ?EKG ?None ? ?Radiology ?No results found. ? ?Procedures ?Procedures  ? ? ?Medications Ordered in ED ?Medications - No data to display ? ?ED Course/ Medical Decision Making/ A&P ?  ?                        ?Medical Decision Making ?Amount and/or Complexity of Data Reviewed ?Radiology: ordered. ? ? ?71 yo F with a chief complaints of bilateral sciatica and numbness to her feet.  Has had some difficulty with leg weakness and has had difficulty walking that is gotten progressively worse.   Started after she played tennis.  Has had plain films of this which were reported as negative.  She was sent here by her doctor for MRI. ? ?The patient does have 3 red flags age and bilateral sciatica as well as numbness and weakness.  Unfortunately we do not have MRI at this facility today.  I discussed the case with Dr. Doren Custard who is at [facility].  Excepted the patient in ED to ED transfer for MRI. ? ?The patients results and plan were reviewed and discussed.   ?Any x-rays performed were independently reviewed by myself.  ? ?Differential diagnosis were considered with the presenting HPI. ? ?Medications - No data to display ? ?Vitals:  ? 08/23/21 0945 08/23/21 0945 08/23/21 0957  ?BP:  133/88 (!) 146/84  ?Pulse:  81 78  ?Resp:  18 15  ?Temp:  98.1 ?F (36.7 ?C) 98.1 ?F (36.7 ?C)  ?TempSrc:  Oral   ?SpO2:  100% 99%  ?Weight: 75.3 kg    ?Height: '5\' 2"'$  (1.575 m)    ? ? ?Final diagnoses:  ?Bilateral sciatica  ? ? ? ? ? ? ? ? ? ? ?Final Clinical Impression(s) / ED Diagnoses ?Final diagnoses:  ?Bilateral sciatica  ? ? ?Rx / DC Orders ?ED Discharge Orders   ? ? None  ? ?  ? ? ?  ?Deno Etienne, DO ?08/23/21 1015 ? ?

## 2021-08-23 NOTE — ED Notes (Signed)
Pt transported to MRI 

## 2021-08-23 NOTE — H&P (Signed)
Subjective: ?Patient is a 71 y.o. female who complains of back pain and gait disturbance. Onset of symptoms was 1 week ago, stable since that time.  Onset was not related to a fall. The pain is rated moderate, and is located at the across the lower back and into the legs in an L5 distribution. The pain is described as aching and occurs all day. The symptoms denies been progressive. Symptoms are exacerbated by exercise, standing, and walking for more than several minutes. The patient saw her primary care physician has tried gabapentin and steroids and muscle relaxant. MRI showed severe spinal stenosis at L4-5 with a grade 1 spondylolisthesis.  She has a history of osteoporosis and has had 2 Reclast shots.  He has a history of neck pain on the left from C1-2 facet arthropathy and has undergone physical therapy, medical therapy, and injection therapy through pain management.  She had an MRI and a CT scan of the cervical spine 6 months ago and I have read those reports.  I read the office note from an orthopedic surgeon who saw her after that.  Last week she has had trouble walking.  No bowel or bladder issues. ? ?History reviewed. No pertinent past medical history.  ?Past Surgical History:  ?Procedure Laterality Date  ? NECK EXPLORATION    ?  ?Allergies  ?Allergen Reactions  ? Sulfa Antibiotics   ?  ?Social History  ? ?Tobacco Use  ? Smoking status: Never  ? Smokeless tobacco: Never  ?Substance Use Topics  ? Alcohol use: Yes  ?  Comment: occasionally  ?  ?Family History  ?Problem Relation Age of Onset  ? Cancer Mother   ? Heart failure Father   ? ?Prior to Admission medications   ?Medication Sig Start Date End Date Taking? Authorizing Provider  ?cyclobenzaprine (FLEXERIL) 5 MG tablet Take 5 mg by mouth 3 (three) times daily as needed. 08/18/21   [provider]  ?gabapentin (NEURONTIN) 300 MG capsule Take by mouth. 08/18/21   [provider]  ?gabapentin (NEURONTIN) 600 MG tablet Take 600 mg by mouth  daily. 06/28/21   [provider]  ?pramipexole (MIRAPEX) 1.5 MG tablet Take 1.5-4.5 mg by mouth at bedtime. 08/08/21   [provider]  ?predniSONE (DELTASONE) 20 MG tablet Take 20 mg by mouth 2 (two) times daily. 08/19/21   [provider]  ?rosuvastatin (CRESTOR) 5 MG tablet Take 5 mg by mouth daily. 06/22/21   [provider]  ? ?  ?Review of Systems ? ?Positive ROS: As above ? ?All other systems have been reviewed and were otherwise negative with the exception of those mentioned in the HPI and as above. ? ?Objective: ?Vital signs in last 24 hours: ?Temp:  [98.1 ?F (36.7 ?C)] 98.1 ?F (36.7 ?C) (04/10 0957) ?Pulse Rate:  [78-89] 84 (04/10 1532) ?Resp:  [15-18] 16 (04/10 1532) ?BP: (113-146)/(75-88) 125/76 (04/10 1532) ?SpO2:  [98 %-100 %] 98 % (04/10 1532) ?Weight:  [75.3 kg] 75.3 kg (04/10 0945) ? ?General Appearance: Alert, cooperative, no distress, appears stated age ?Head: Normocephalic, without obvious abnormality, atraumatic ?Eyes: PERRL, conjunctiva/corneas clear, EOM's intact, fundi benign, both eyes      ?Ears: Normal TM's and external ear canals, both ears ?Throat: Lips, mucosa, and tongue normal; teeth and gums normal ?Neck: Supple, symmetrical, trachea midline, no adenopathy; thyroid: No enlargement/tenderness/nodules; no carotid bruit or JVD ?Back: Symmetric, no curvature, ROM normal, no CVA tenderness ?Lungs: Clear to auscultation bilaterally, respirations unlabored ?Heart: Regular rate and rhythm,  S1 and S2 normal, no murmur, rub or gallop ?Abdomen: Soft, non-tender, bowel sounds active all four quadrants, no masses, no organomegaly ?Extremities: Extremities normal, atraumatic, no cyanosis or edema ?Pulses: 2+ and symmetric all extremities ?Skin: Skin color, texture, turgor normal, no rashes or lesions ? ?NEUROLOGIC:  ? ?Mental status: alert and oriented, no aphasia, good attention span, Fund of knowledge/ memory ok ?Motor Exam - grossly normal with 5 out of 5 strength  in the upper and lower extremities ?Sensory Exam - grossly normal ?Reflexes: 1+, negative Hoffmann sign, no clonus ?Coordination - grossly normal ?Gait -antalgic but not spastic ?Balance -somewhat abnormal ?Cranial Nerves: ?I: smell Not tested  ?II: visual acuity  OS: na    OD: na  ?II: visual fields Full to confrontation  ?II: pupils Equal, round, reactive to light  ?III,VII: ptosis None  ?III,IV,VI: extraocular muscles  Full ROM  ?V: mastication Normal  ?V: facial light touch sensation  Normal  ?V,VII: corneal reflex  Present  ?VII: facial muscle function - upper  Normal  ?VII: facial muscle function - lower Normal  ?VIII: hearing Not tested  ?IX: soft palate elevation  Normal  ?IX,X: gag reflex Present  ?XI: trapezius strength  5/5  ?XI: sternocleidomastoid strength 5/5  ?XI: neck flexion strength  5/5  ?XII: tongue strength  Normal  ? ? ?Data Review ?No results found for: WBC, HGB, HCT, MCV, PLT ?No results found for: NA, K, CL, CO2, BUN, CREATININE, GLUCOSE ?No results found for: INR, PROTIME ? ?Assessment/Plan: ?71 year old female with a 1 week history of significant gait disturbance, though she has good strength in her legs.  She has back pain with bilateral L5 radicular pain.  She has severe spinal stenosis at L4-5 almost reaching the critical level with a grade 1 spondylolisthesis at that level.  We are going to admit her for further work-up.  I think she is likely going to need a decompression and instrumented fusion to address her severe stenosis with segmental instability.  I would like to check her bone density test.  I like to check some labs.  Do not think she is myelopathic.  I have spoken with she and her husband at length and they are pleased with the plan. ? ? ?Eustace Moore ?08/23/2021 5:29 PM ? ?   ?

## 2021-08-23 NOTE — ED Notes (Signed)
Gave Bonnie Robinson charge at [facility] report ?

## 2021-08-23 NOTE — ED Provider Notes (Signed)
Signout from Dr. Jeanell Sparrow.  71 year old female with no significant past medical history here with progressive numbness weakness lower extremities over the course of a week.  She is pending neurosurgery consult for abnormal MRI.  Disposition per neurosurgery recommendations. ?Physical Exam  ?BP 125/76   Pulse 84   Temp 98.1 ?F (36.7 ?C)   Resp 16   Ht '5\' 2"'$  (1.575 m)   Wt 75.3 kg   SpO2 98%   BMI 30.36 kg/m?  ? ?Physical Exam ? ?Procedures  ?Procedures ? ?ED Course / MDM  ?  ?Medical Decision Making ?Amount and/or Complexity of Data Reviewed ?Radiology: ordered. ? ?Risk ?Prescription drug management. ? ? ?Patient seen by Dr. Ronnald Ramp neurosurgery.  It sounds like he will be admitting her for further management. ? ? ? ? ?  ?Hayden Rasmussen, MD ?08/24/21 1706 ? ?

## 2021-08-23 NOTE — ED Notes (Signed)
MRI aware of patient transfer ?

## 2021-08-24 LAB — COMPREHENSIVE METABOLIC PANEL
ALT: 26 U/L (ref 0–44)
AST: 47 U/L — ABNORMAL HIGH (ref 15–41)
Albumin: 3.5 g/dL (ref 3.5–5.0)
Alkaline Phosphatase: 42 U/L (ref 38–126)
Anion gap: 8 (ref 5–15)
BUN: 27 mg/dL — ABNORMAL HIGH (ref 8–23)
CO2: 24 mmol/L (ref 22–32)
Calcium: 9.3 mg/dL (ref 8.9–10.3)
Chloride: 107 mmol/L (ref 98–111)
Creatinine, Ser: 0.7 mg/dL (ref 0.44–1.00)
GFR, Estimated: >60 mL/min (ref >60)
Glucose, Bld: 140 mg/dL — ABNORMAL HIGH (ref 70–99)
Potassium: 5.9 mmol/L — ABNORMAL HIGH (ref 3.5–5.1)
Sodium: 139 mmol/L (ref 135–145)
Total Bilirubin: 0.9 mg/dL (ref 0.3–1.2)
Total Protein: 5.9 g/dL — ABNORMAL LOW (ref 6.5–8.1)

## 2021-08-24 LAB — CBC
HCT: 40.6 % (ref 36.0–46.0)
Hemoglobin: 13.3 g/dL (ref 12.0–15.0)
MCH: 29.4 pg (ref 26.0–34.0)
MCHC: 32.8 g/dL (ref 30.0–36.0)
MCV: 89.8 fL (ref 80.0–100.0)
Platelets: 292 10*3/uL (ref 150–400)
RBC: 4.52 MIL/uL (ref 3.87–5.11)
RDW: 13.2 % (ref 11.5–15.5)
WBC: 10.8 10*3/uL — ABNORMAL HIGH (ref 4.0–10.5)
nRBC: 0 % (ref 0.0–0.2)

## 2021-08-24 MED ORDER — PRAMIPEXOLE DIHYDROCHLORIDE 1.5 MG PO TABS
4.5000 mg | ORAL_TABLET | Freq: Every day | ORAL | Status: DC
  Administered 2021-08-25 – 2021-08-26 (×2): 4.5 mg via ORAL
  Filled 2021-08-24 (×4): qty 3

## 2021-08-24 NOTE — Progress Notes (Signed)
Subjective: ?Patient reports doing ok, didn't get much sleep, walking is not any better ? ?Objective: ?Vital signs in last 24 hours: ?Temp:  [98.1 ?F (36.7 ?C)-98.3 ?F (36.8 ?C)] 98.3 ?F (36.8 ?C) (04/11 0355) ?Pulse Rate:  [78-89] 83 (04/11 0758) ?Resp:  [15-18] 17 (04/11 0758) ?BP: (111-146)/(71-88) 135/80 (04/11 0758) ?SpO2:  [95 %-100 %] 97 % (04/11 0758) ?Weight:  [75.3 kg] 75.3 kg (04/10 0945) ? ?Intake/Output from previous day: ?No intake/output data recorded. ?Intake/Output this shift: ?No intake/output data recorded. ? ?Neurologic: Grossly normal ? ?Lab Results: ?Lab Results  ?Component Value Date  ? WBC 10.8 (H) 08/24/2021  ? HGB 13.3 08/24/2021  ? HCT 40.6 08/24/2021  ? MCV 89.8 08/24/2021  ? PLT 292 08/24/2021  ? ?No results found for: INR, PROTIME ?BMET ?Lab Results  ?Component Value Date  ? NA 139 08/24/2021  ? K 5.9 (H) 08/24/2021  ? CL 107 08/24/2021  ? CO2 24 08/24/2021  ? GLUCOSE 140 (H) 08/24/2021  ? BUN 27 (H) 08/24/2021  ? CREATININE 0.70 08/24/2021  ? CALCIUM 9.3 08/24/2021  ? ? ?Studies/Results: ?DG Lumbar Spine 2-3 Views ? ?Result Date: 08/23/2021 ?CLINICAL DATA:  Low back pain EXAM: LUMBAR SPINE - 2-3 VIEW COMPARISON:  08/19/2021 FINDINGS: Levoscoliosis. Five non rib-bearing lumbar type vertebra. Grade 1 anterolisthesis L4 on L5. Vertebral body heights are maintained. Moderate disc space narrowing at L1-L2 and L3-L4 with advanced disc space narrowing and degenerative change L2-L3. Mild degenerative changes L4 through S1. Facet degenerative changes of the lower lumbar spine. IMPRESSION: Scoliosis with degenerative change, most advanced at L2-L3 Electronically Signed   By: Donavan Foil M.D.   On: 08/23/2021 20:52  ? ?MR LUMBAR SPINE WO CONTRAST ? ?Result Date: 08/23/2021 ?CLINICAL DATA:  Low back pain radiating down both legs EXAM: MRI LUMBAR SPINE WITHOUT CONTRAST TECHNIQUE: Multiplanar, multisequence MR imaging of the lumbar spine was performed. No intravenous contrast was administered.  COMPARISON:  Lumbar spine radiographs 08/19/2021 FINDINGS: Segmentation: Standard; the lowest formed disc space is designated L5-S1. Alignment: There is levoscoliosis centered at L3. There is grade 1 anterolisthesis of L4 on L5, likely degenerative in nature. Vertebrae: Vertebral body heights are preserved. There is mild degenerative endplate marrow signal abnormality at L2-L3. There is no suspicious signal abnormality. There is no marrow edema. Conus medullaris and cauda equina: Conus extends to the L1-L2 level. Conus and cauda equina appear normal. Paraspinal and other soft tissues: T2 hyperintense renal lesions likely reflects cysts, for which no specific imaging follow-up is required. The paraspinal soft tissues are unremarkable. Disc levels: There is multilevel disc desiccation and narrowing, most advanced at L2-L3. There is multilevel facet arthropathy, most advanced at L4-L5 and L5-S1 T12-L1: There is a mild disc bulge and mild bilateral facet arthropathy without significant spinal canal or neural foraminal stenosis L1-L2: There is a mild disc bulge and right worse than left facet arthropathy resulting in mild narrowing of the right subarticular zone without evidence of nerve root impingement without significant neural foraminal stenosis L2-L3: There is a mild disc bulge and right worse than left facet arthropathy resulting in narrowing of the right subarticular zone with possible irritation of the traversing L3 nerve root and mild right worse than left neural foraminal stenosis. L3-L4: There is a diffuse disc bulge and bilateral facet arthropathy resulting in mild spinal canal stenosis with narrowing of the subarticular zones, left worse than right, and mild bilateral neural foraminal stenosis L4-L5: There is grade 1 anterolisthesis with uncovering of the disc posteriorly  and a superimposed mild bulge and severe bilateral facet arthropathy resulting in severe spinal canal stenosis with impingement of the  cauda equina nerve roots and mild-to-moderate left and mild right neural foraminal stenosis L5-S1: There is a mild disc bulge and moderate left worse than right facet arthropathy with trace effusions resulting in narrowing of the left subarticular zone with potential irritation of the traversing S1 nerve root and mild-to-moderate left and no significant right neural foraminal stenosis. IMPRESSION: 1. Grade 1 anterolisthesis of L4 on L5 with associated advanced facet arthropathy and disc bulge resulting in severe spinal canal stenosis with impingement of the cauda equina nerve roots and mild-to-moderate left and mild right neural foraminal stenosis. 2. Subarticular zone narrowing on the right at L1-L2 and L2-L3, bilaterally at L3-L4 (left worse than right), and on the left at L5-S1 with potential irritation of the traversing nerve roots. There is also mild-to-moderate left neural foraminal stenosis at L5-S1. 3. Levoscoliosis centered at L3. Electronically Signed   By: Valetta Mole M.D.   On: 08/23/2021 14:44   ? ?Assessment/Plan: ?Awaiting bed placement from ED to a floor. Plan is to work with therapy today. Surgery will be on Friday.  ? ? LOS: 1 day  ? ? ?Ocie Cornfield Noheli Melder ?08/24/2021, 8:14 AM ? ? ?  ?

## 2021-08-24 NOTE — Evaluation (Signed)
Physical Therapy Evaluation ?Patient Details ?Name: Bonnie Robinson ?MRN: [id number] ?DOB: Mar 01, 1951 ?Today's Date: 08/24/2021 ? ?History of Present Illness ? Patient is a 71 y.o. female admitted 4/10 who complains of back pain and gait disturbance. Onset of symptoms was 1 week ago, stable since that time. The patient saw her primary care physician has tried gabapentin and steroids and muscle relaxant. MRI showed severe spinal stenosis at L4-5 with a grade 1 spondylolisthesis.  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  She had an MRI and a CT scan of the cervical spine 6 months ago  ?Clinical Impression ? Pt admitted with above diagnosis. Pt was able to ambulate in ED with min guard assist and cues for walker safety. Pt plan is for decompression on Fri, 4/14.  Will follow acutely.  Pt currently with functional limitations due to the deficits listed below (see PT Problem List). Pt will benefit from skilled PT to increase their independence and safety with mobility to allow discharge to the venue listed below.      ?   ? ?Recommendations for follow up therapy are one component of a multi-disciplinary discharge planning process, led by the attending physician.  Recommendations may be updated based on patient status, additional functional criteria and insurance authorization. ? ?Follow Up Recommendations Home health PT (most likely but may need to reassess after surgery) ? ?  ?Assistance Recommended at Discharge Set up Supervision/Assistance  ?Patient can return home with the following ? A little help with walking and/or transfers;A little help with bathing/dressing/bathroom;Assistance with cooking/housework;Assist for transportation;Help with stairs or ramp for entrance ? ?  ?Equipment Recommendations None recommended by PT  ?Recommendations for Other Services ?    ?  ?Functional Status Assessment Patient has  had a recent decline in their functional status and demonstrates the ability to make significant improvements in function in a reasonable and predictable amount of time.  ? ?  ?Precautions / Restrictions Precautions ?Precautions: Back (pre-educated in back precautions for prep for upcoming surgery) ?Restrictions ?Weight Bearing Restrictions: No  ? ?  ? ?Mobility ? Bed Mobility ?  ?  ?  ?  ?  ?  ?  ?General bed mobility comments: Pt in chair ?  ? ?Transfers ?Overall transfer level: Needs assistance ?Equipment used: Standard walker ?Transfers: Sit to/from Stand ?Sit to Stand: Min guard ?  ?  ?  ?  ?  ?General transfer comment: cues for hand placement ?  ? ?Ambulation/Gait ?Ambulation/Gait assistance: Min guard ?Gait Distance (Feet): 200 Feet ?Assistive device: Standard walker ?Gait Pattern/deviations: Step-through pattern, Decreased stride length ?  ?Gait velocity interpretation: <1.8 ft/sec, indicate of risk for recurrent falls ?  ?General Gait Details: Cued pt for appropirate use of standard walker as she wanted to put it too far out at times.  STeady with SW and would like to try RW next visit. ? ?Stairs ?  ?  ?  ?  ?  ? ?Wheelchair Mobility ?  ? ?Modified Rankin (Stroke Patients Only) ?  ? ?  ? ?Balance Overall balance assessment: Needs assistance ?Sitting-balance support: No upper extremity supported, Feet supported ?Sitting balance-Leahy Scale: Fair ?  ?  ?Standing balance support: Bilateral upper extremity supported, During functional activity, Reliant on assistive device for balance ?Standing balance-Leahy Scale: Poor ?  ?  ?  ?  ?  ?  ?  ?  ?  ?  ?  ?  ?   ? ? ? ?  Pertinent Vitals/Pain Pain Assessment ?Pain Assessment: Faces ?Faces Pain Scale: Hurts whole lot ?Pain Location: low back and neck ?Pain Descriptors / Indicators: Aching ?Pain Intervention(s): Limited activity within patient's tolerance, Monitored during session, Repositioned, Ice applied, Heat applied (heat to neck and ice to back)  ? ? ?Home Living  Family/patient expects to be discharged to:: Private residence ?Living Arrangements: Spouse/significant other ?Available Help at Discharge: Family;Available 24 hours/day ?Type of Home: House ?Home Access: Stairs to enter ?Entrance Stairs-Rails: Right ?Entrance Stairs-Number of Steps: 2 ?  ?Home Layout: Able to live on main level with bedroom/bathroom;Full bath on main level;Two level ?Home Equipment: Shower seat - built Medical sales representative (2 wheels);Hand held shower head;Standard Gilford Rile ?   ?  ?Prior Function Prior Level of Function : Driving;Independent/Modified Independent ?  ?  ?  ?  ?  ?  ?Mobility Comments: recently began using walker for safety due to pain ?ADLs Comments: I per pt ?  ? ? ?Hand Dominance  ? Dominant Hand: Right ? ?  ?Extremity/Trunk Assessment  ? Upper Extremity Assessment ?Upper Extremity Assessment: Defer to OT evaluation ?  ? ?Lower Extremity Assessment ?Lower Extremity Assessment: Overall WFL for tasks assessed ?  ? ?Cervical / Trunk Assessment ?Cervical / Trunk Assessment: Normal  ?Communication  ?    ?Cognition Arousal/Alertness: Awake/alert ?Behavior During Therapy: [facility] for tasks assessed/performed ?Overall Cognitive Status: Within Functional Limits for tasks assessed ?  ?  ?  ?  ?  ?  ?  ?  ?  ?  ?  ?  ?  ?  ?  ?  ?  ?  ?  ? ?  ?General Comments General comments (skin integrity, edema, etc.): Husband and pt educated regarding back precautions and adaptive equipment.  VSS ? ?  ?Exercises    ? ?Assessment/Plan  ?  ?PT Assessment Patient needs continued PT services  ?PT Problem List Decreased balance;Decreased mobility;Decreased activity tolerance;Decreased knowledge of use of DME;Decreased safety awareness;Decreased knowledge of precautions;Pain ? ?   ?  ?PT Treatment Interventions DME instruction;Gait training;Functional mobility training;Therapeutic activities;Therapeutic exercise;Balance training;Patient/family education;Stair training   ? ?PT Goals (Current goals can be found in the  Care Plan section)  ?Acute Rehab PT Goals ?Patient Stated Goal: to go home after surgery ?PT Goal Formulation: With patient ?Time For Goal Achievement: 09/07/21 ?Potential to Achieve Goals: Good ? ?  ?Frequency Min 3X/week ?  ? ? ?Co-evaluation   ?  ?  ?  ?  ? ? ?  ?AM-PAC PT "6 Clicks" Mobility  ?Outcome Measure Help needed turning from your back to your side while in a flat bed without using bedrails?: A Little ?Help needed moving from lying on your back to sitting on the side of a flat bed without using bedrails?: A Little ?Help needed moving to and from a bed to a chair (including a wheelchair)?: A Little ?Help needed standing up from a chair using your arms (e.g., wheelchair or bedside chair)?: A Little ?Help needed to walk in hospital room?: A Little ?Help needed climbing 3-5 steps with a railing? : A Little ?6 Click Score: 18 ? ?  ?End of Session Equipment Utilized During Treatment: Gait belt ?Activity Tolerance: Patient tolerated treatment well ?Patient left: in chair;with call bell/phone within reach;with family/visitor present ?Nurse Communication: Mobility status ?PT Visit Diagnosis: Muscle weakness (generalized) (M62.81);Pain ?Pain - part of body:  (back) ?  ? ?Time: 5809-9833 ?PT Time Calculation (min) (ACUTE ONLY): 39 min ? ? ?Charges:   PT Evaluation ?$  PT Eval Moderate Complexity: 1 Mod ?PT Treatments ?$Gait Training: 8-22 mins ?$Self Care/Home Management: 8-22 ?  ?   ? ? ?Laurence Crofford M,PT ?Acute Rehab Services ?[phone] ?[phone] (pager)  ? ?Avanthika Dehnert F Lariah Fleer ?08/24/2021, 12:34 PM ?

## 2021-08-24 NOTE — ED Notes (Signed)
Pt ambulatory with walker.

## 2021-08-25 ENCOUNTER — Inpatient Hospital Stay (HOSPITAL_COMMUNITY): Payer: PPO

## 2021-08-25 LAB — BASIC METABOLIC PANEL
Anion gap: 7 (ref 5–15)
BUN: 25 mg/dL — ABNORMAL HIGH (ref 8–23)
CO2: 28 mmol/L (ref 22–32)
Calcium: 9.2 mg/dL (ref 8.9–10.3)
Chloride: 107 mmol/L (ref 98–111)
Creatinine, Ser: 0.68 mg/dL (ref 0.44–1.00)
GFR, Estimated: >60 mL/min (ref >60)
Glucose, Bld: 125 mg/dL — ABNORMAL HIGH (ref 70–99)
Potassium: 4.6 mmol/L (ref 3.5–5.1)
Sodium: 142 mmol/L (ref 135–145)

## 2021-08-25 IMAGING — MR MR CERVICAL SPINE W/O CM
4 of 5 series · 17 of 48 positions shown · non-contrast
Comparison: Cervical spine CT [DATE].  MRI [DATE].

CLINICAL DATA: 70-year-old female with chronic neck pain and gait
disturbance. C4-C5 cervical fusion on CT last year.

EXAM:
MRI CERVICAL SPINE WITHOUT CONTRAST
TECHNIQUE: Multiplanar, multisequence MR imaging of the cervical spine was
performed. No intravenous contrast was administered.

[Series 2: T2 · sagittal · 3.0mm · 0.43mm/px · 8 of 14 slices shown (1 of 2)]
[im 1/14]
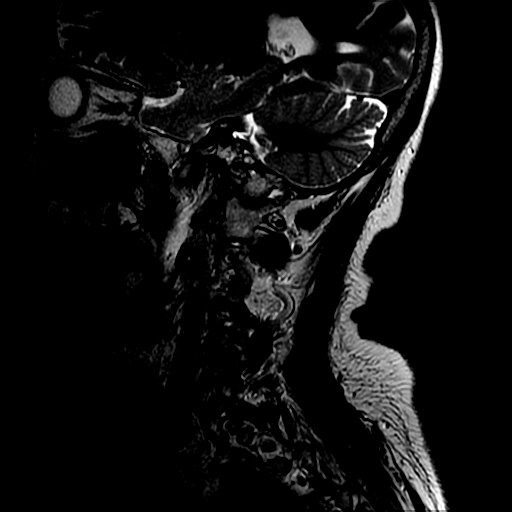
[im 2/14]
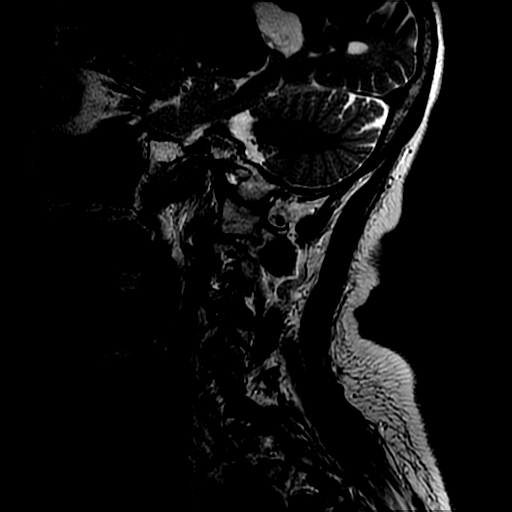
[im 4/14]
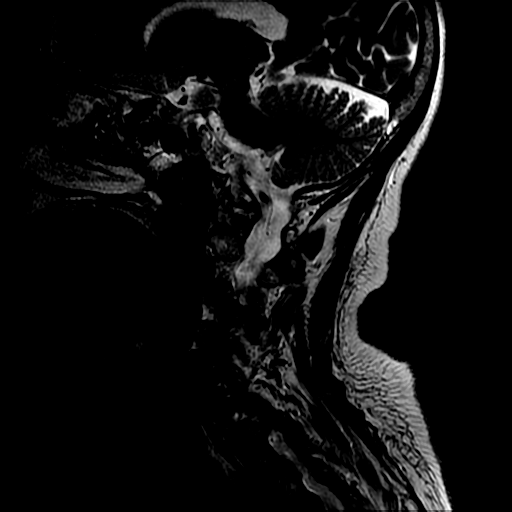
[im 6/14]
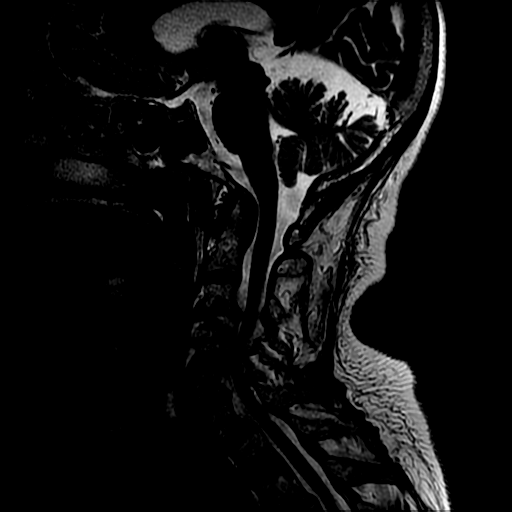
[im 8/14]
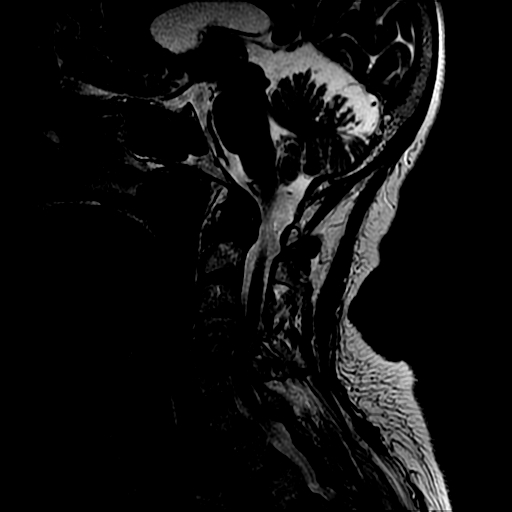
[im 10/14]
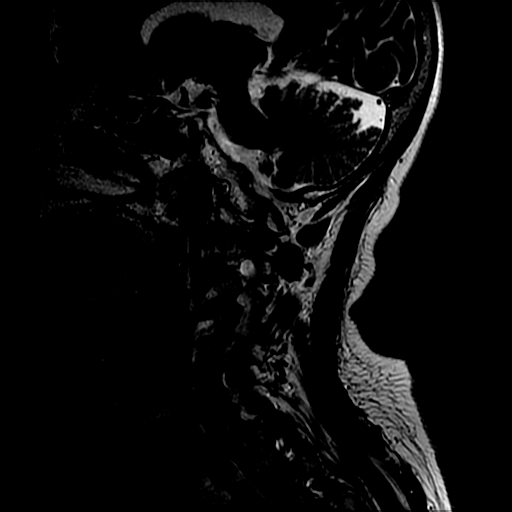
[im 12/14]
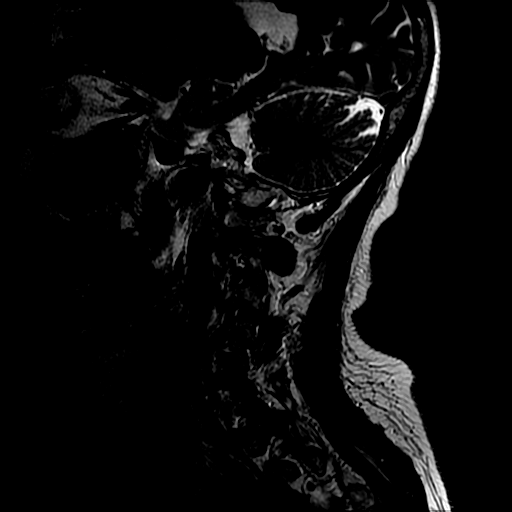
[im 14/14]
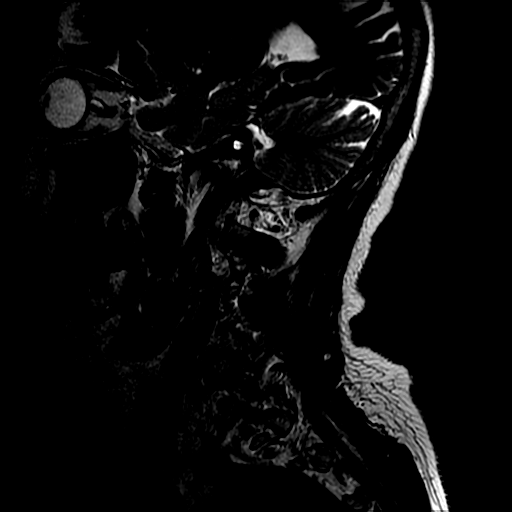

[Series 3: sag ir · sagittal · 3.0mm · 0.43mm/px · 3 of 14 slices shown]
[im 3/14]
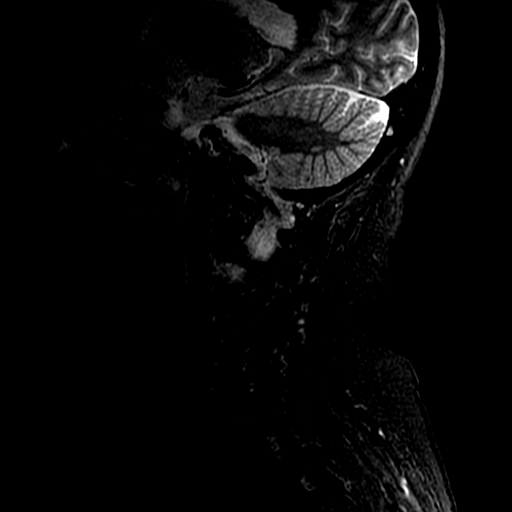
[im 7/14]
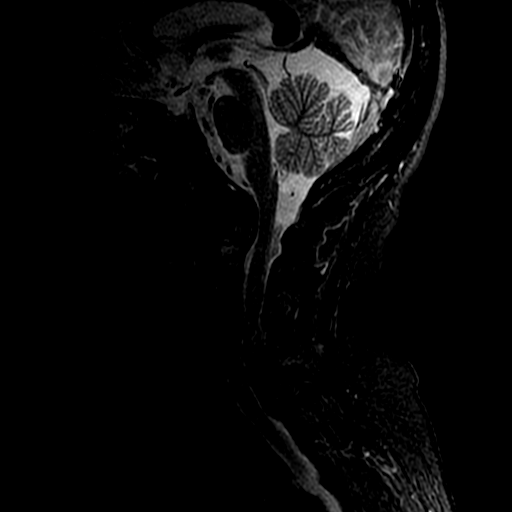
[im 11/14]
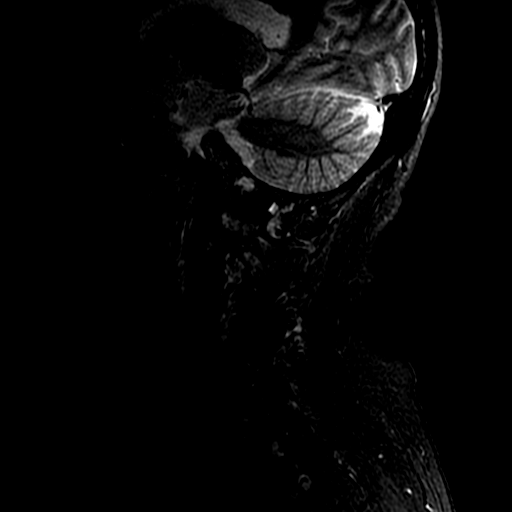

[Series 4: T1 · sagittal · 3.0mm · 0.43mm/px · 3 of 14 slices shown]
[im 3/14]
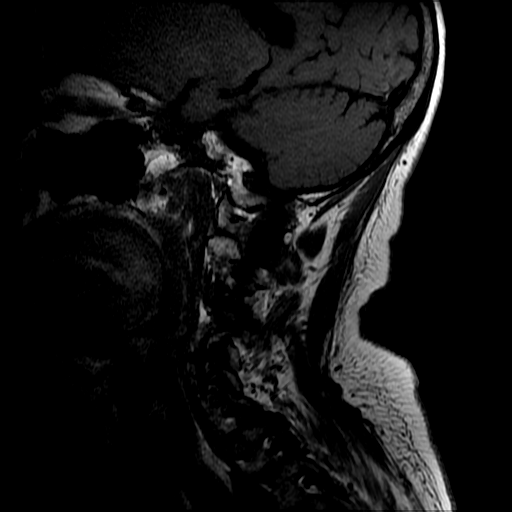
[im 7/14]
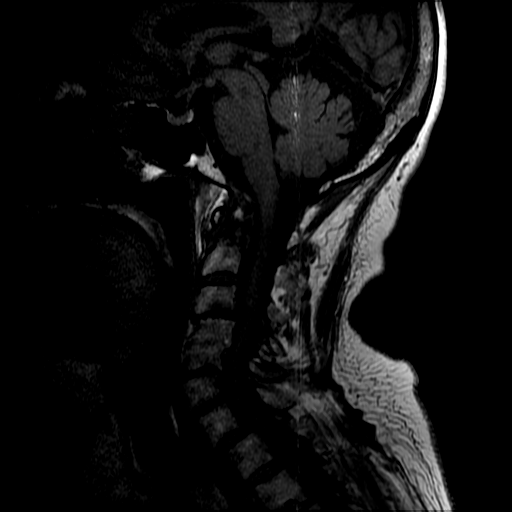
[im 11/14]
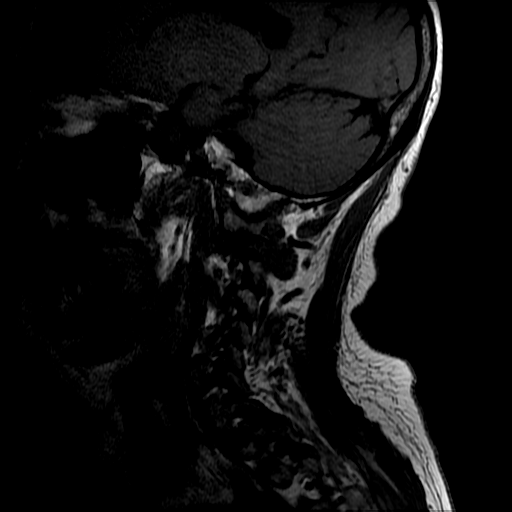

[Series 6: T2 · axial · 3.0mm · 0.39mm/px · z∈[-35,+19]mm · 3 of 25 slices shown (2 of 2)]
[im 5/25]
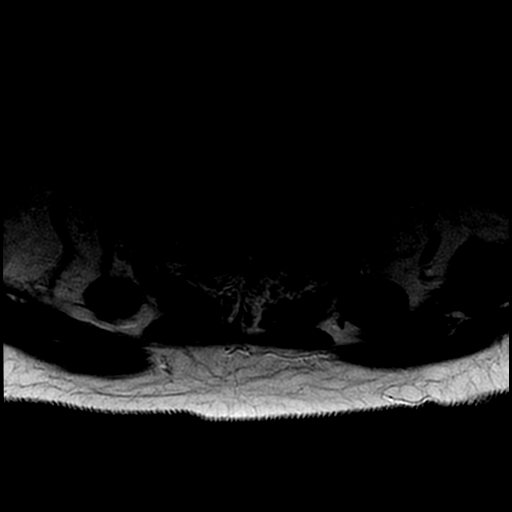
[im 13/25]
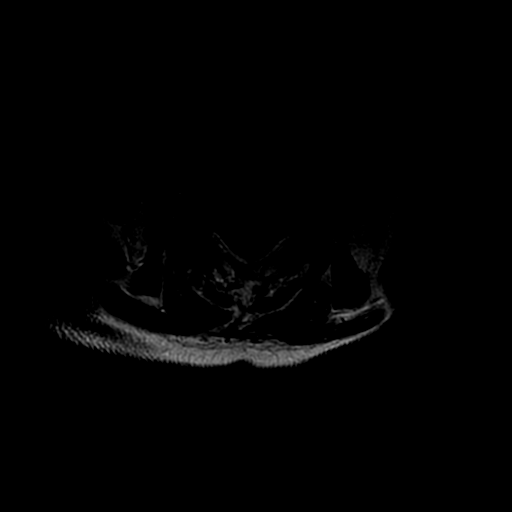
[im 21/25]
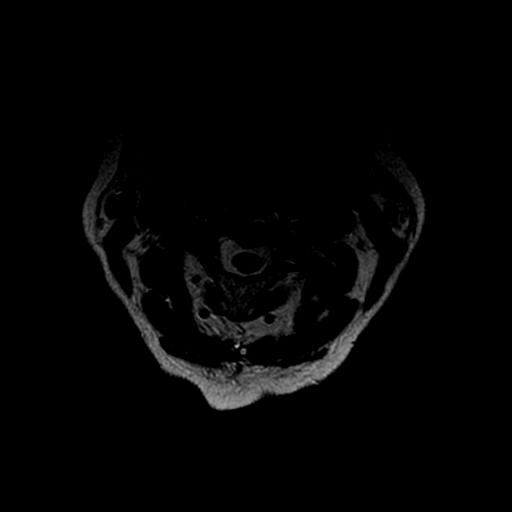

[17 of 48 positions shown; findings below may reference images not displayed]

FINDINGS: Alignment: Stable from last year, exaggerated lower cervical
lordosis and straightening of upper cervical lordosis with mild
degenerative anterolisthesis of C3 on C4, C7 on T1.

Vertebrae: Ongoing degenerative appearing marrow edema in the left
C1 and C2 vertebrae, with severe joint space loss there by CT last
year (bone-on-bone appearance and vacuum phenomena). Superimposed
degenerative appearing marrow heterogeneity in the odontoid, with
regressed odontoid edema since last year.

Chronic C4-C5 interbody ankylosis. Degenerative endplate marrow
signal changes elsewhere with normal background bone marrow signal.

Cord: Chronic cord compression at C4-C5 (series 6, image 13),
although no definite associated abnormal cord signal and stable
appearance to the MRI last year. Above and below that level no
abnormal cord signal despite some additional degenerative cord mass
effect. Visible upper thoracic spinal cord appears within normal
limits.

Posterior Fossa, vertebral arteries, paraspinal tissues:
Cervicomedullary junction is within normal limits. Patchy left
posterior cerebellar chronic encephalomalacia is visible (series 2,
image 11). Otherwise grossly negative visible posterior fossa and
brain parenchyma.

Major vascular flow voids in the neck appear preserved. Negative
visible neck soft tissues and lung apices.

Disc levels:

C2-C3:  Negative.

C3-C4: Anterolisthesis with moderate to severe disc space loss,
right eccentric disc osteophyte complex, and severe right facet
hypertrophy. Mild spinal stenosis and spinal cord mass effect does
appear increased from last year on series 6, image 8. Moderate left
and severe right C4 foraminal stenosis is stable.

C4-C5: Chronic interbody ankylosis. But residual endplate spurring,
and ligament flavum hypertrophy at the C5 level result in up to
moderate spinal stenosis and spinal cord mass effect on series 2,
image 7 and series 6, image 13. Mild to moderate C5 foraminal
stenosis mostly on the left appears stable.

C5-C6: Chronic disc space loss and circumferential disc osteophyte
complex. Ligament flavum and facet hypertrophy. Mild spinal stenosis
and spinal cord mass effect are stable to increased. Mild to
moderate left and moderate to severe right C6 foraminal stenosis is
stable.

C6-C7: Lesser disc bulging and endplate spurring here. Mild to
moderate facet hypertrophy. No spinal stenosis. Mild to moderate
right C7 foraminal stenosis is stable.

C7-T1: Chronic anterolisthesis with disc bulging, moderate to severe
facet and ligament flavum hypertrophy. No spinal stenosis. Mild
bilateral C8 foraminal stenosis is stable.
IMPRESSION: 1. Up to moderate degenerative spinal stenosis and spinal cord mass
effect at C4-C5 does not appear significantly changed from the MRI
last year, with cord compression but no definite abnormal cord
signal. Underlying interbody ankylosis there but endplate spurring
and posterior ligamentous hypertrophy contribute to stenosis.

2. Advanced cervical spine degeneration elsewhere, with mild
progression of chronic multifactorial spinal stenosis and spinal
cord mass at C3-C4, C5-C6. Chronic moderate or severe bilateral C4,
right C6 neural foraminal stenosis.

3. Chronic severe C1-C2 joint space loss on the left with ongoing
associated marrow edema.

4. Small area of chronic cerebellar encephalomalacia suspected on
the left.

## 2021-08-25 MED ORDER — PANTOPRAZOLE SODIUM 40 MG PO TBEC
40.0000 mg | DELAYED_RELEASE_TABLET | Freq: Every day | ORAL | Status: DC
  Administered 2021-08-25 – 2021-08-26 (×2): 40 mg via ORAL
  Filled 2021-08-25 (×2): qty 1

## 2021-08-25 NOTE — Plan of Care (Signed)
  Problem: Pain Managment: Goal: General experience of comfort will improve Outcome: Progressing   Problem: Safety: Goal: Ability to remain free from injury will improve Outcome: Progressing   

## 2021-08-25 NOTE — Progress Notes (Signed)
Physical Therapy Treatment ?Patient Details ?Name: Bonnie Robinson ?MRN: [id number] ?DOB: [birth year] ?Today's Date: 08/25/2021 ? ? ?History of Present Illness Patient is a 71 y.o. female admitted 4/10 who complains of back pain and gait disturbance. Onset of symptoms was 1 week ago, stable since that time. The patient saw her primary care physician has tried gabapentin and steroids and muscle relaxant. MRI showed severe spinal stenosis at L4-5 with a grade 1 spondylolisthesis.  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  She had an MRI and a CT scan of the cervical spine 6 months ago ? ?  ?PT Comments  ? ? Focused session on training pt with use of RW when ambulating and on stair negotiation. Pt was able to ambulate using the RW and navigate stairs using bil rails with reciprocal gait pattern without LOB or need for assistance, but she does display weakness and deficits in sensation in her lower extremities (primarily her L) that impact her safety and place her at risk for falls. Educated pt and her family on spinal precautions and how to enter/exit her high bed at home while maintaining the precautions in preparation for surgery. Will continue to follow acutely. Current recommendations remain appropriate. ?   ?Recommendations for follow up therapy are one component of a multi-disciplinary discharge planning process, led by the attending physician.  Recommendations may be updated based on patient status, additional functional criteria and insurance authorization. ? ?Follow Up Recommendations ? Home health PT (most likely but may need to re-assess after surgery) ?  ?  ?Assistance Recommended at Discharge Set up Supervision/Assistance  ?Patient can return home with the following A little help with walking and/or transfers;A little help with bathing/dressing/bathroom;Assistance with cooking/housework;Assist for  transportation;Help with stairs or ramp for entrance ?  ?Equipment Recommendations ? Rolling walker (2 wheels) (if does not already have one)  ?  ?Recommendations for Other Services   ? ? ?  ?Precautions / Restrictions Precautions ?Precautions: Back (pre-education in back precautions in prep for upcoming surgery) ?Required Braces or Orthoses:  (no brace needed pre-op) ?Restrictions ?Weight Bearing Restrictions: No  ?  ? ?Mobility ? Bed Mobility ?Overal bed mobility: Needs Assistance ?Bed Mobility: Sidelying to Sit, Rolling, Sit to Sidelying ?Rolling: Supervision ?Sidelying to sit: Min guard ?  ?  ?Sit to sidelying: Min guard ?General bed mobility comments: Pt perfroming 1st rep from standing to supine through entering R side of elevated bed to simulate home by pt lifting R leg onto bed and crawling onto bed prone and then exiting prone to bring legs off EOB as well. Educated pt on lifting buttocks to reach bed and scooting posteriorly then transitioning to sidelying and rolling supine and vice versa to exit, good comprehension noted with pt performing as instructed, min guard-supervision for safety ?  ? ?Transfers ?Overall transfer level: Needs assistance ?Equipment used: Standard walker ?Transfers: Sit to/from Stand ?Sit to Stand: Min guard ?  ?  ?  ?  ?  ?General transfer comment: Pt pushing up on walker prior to being able to be cued for hand placement. Educated pt to reach back to control descent when sitting. ?  ? ?Ambulation/Gait ?Ambulation/Gait assistance: Min guard ?Gait Distance (Feet): 310 Feet ?Assistive device: Rolling walker (2 wheels), Standard walker ?Gait Pattern/deviations: Step-through pattern, Decreased stride length, Decreased dorsiflexion - left, Trunk flexed ?Gait velocity: reduced ?Gait velocity interpretation: <1.8 ft/sec, indicate of risk  for recurrent falls ?  ?General Gait Details: Pt with slightly slowed, but steady gait. Initially pt was using her standard walker, but transitioned pt to  RW for improved ease. Educated pt on staying inside RW, good carryover noted. Pt with decreased L foot clearance. Cues provided to improve upright posture and relax shoulders. Min guard for safety, no LOB. ? ? ?Stairs ?Stairs: Yes ?Stairs assistance: Min guard ?Stair Management: Two rails, Alternating pattern, Forwards ?Number of Stairs: 10 (4 standard + 6 short) ?General stair comments: Ascending and descending with bil rails in reciprocal pattern, no LOB, min guard for safety. Educated pt to ascend with strong (R) leg and descend leading with weak (L) leg as needed for safety. ? ? ?Wheelchair Mobility ?  ? ?Modified Rankin (Stroke Patients Only) ?  ? ? ?  ?Balance Overall balance assessment: Needs assistance ?Sitting-balance support: No upper extremity supported, Feet supported ?Sitting balance-Leahy Scale: Good ?  ?  ?Standing balance support: No upper extremity supported, Single extremity supported, Bilateral upper extremity supported, During functional activity ?Standing balance-Leahy Scale: Fair ?Standing balance comment: Able to stand without UE support but benefits from UE support for standing mobility stability ?  ?  ?  ?  ?  ?  ?  ?  ?  ?  ?  ?  ? ?  ?Cognition Arousal/Alertness: Awake/alert ?Behavior During Therapy: [facility] for tasks assessed/performed ?Overall Cognitive Status: Within Functional Limits for tasks assessed ?  ?  ?  ?  ?  ?  ?  ?  ?  ?  ?  ?  ?  ?  ?  ?  ?  ?  ?  ? ?  ?Exercises   ? ?  ?General Comments General comments (skin integrity, edema, etc.): Educated pt and her husband and daughter on back precautions ?  ?  ? ?Pertinent Vitals/Pain Pain Assessment ?Pain Assessment: Faces ?Faces Pain Scale: Hurts little more ?Pain Location: low back and neck ?Pain Descriptors / Indicators: Discomfort, Guarding ?Pain Intervention(s): Limited activity within patient's tolerance, Monitored during session, Repositioned  ? ? ?Home Living   ?  ?  ?  ?  ?  ?  ?  ?  ?  ?   ?  ?Prior Function    ?  ?  ?   ? ?PT  Goals (current goals can now be found in the care plan section) Acute Rehab PT Goals ?Patient Stated Goal: to walk better ?PT Goal Formulation: With patient/family ?Time For Goal Achievement: 09/07/21 ?Potential to Achieve Goals: Good ?Progress towards PT goals: Progressing toward goals ? ?  ?Frequency ? ? ?   ? ? ? ?  ?PT Plan Equipment recommendations need to be updated  ? ? ?Co-evaluation   ?  ?  ?  ?  ? ?  ?AM-PAC PT "6 Clicks" Mobility   ?Outcome Measure ? Help needed turning from your back to your side while in a flat bed without using bedrails?: A Little ?Help needed moving from lying on your back to sitting on the side of a flat bed without using bedrails?: A Little ?Help needed moving to and from a bed to a chair (including a wheelchair)?: A Little ?Help needed standing up from a chair using your arms (e.g., wheelchair or bedside chair)?: A Little ?Help needed to walk in hospital room?: A Little ?Help needed climbing 3-5 steps with a railing? : A Little ?6 Click Score: 18 ? ?  ?End of Session   ?Activity Tolerance:  Patient tolerated treatment well ?Patient left: in chair;with call bell/phone within reach;with family/visitor present ?Nurse Communication: Mobility status ?PT Visit Diagnosis: Muscle weakness (generalized) (M62.81);Pain;Unsteadiness on feet (R26.81);Other abnormalities of gait and mobility (R26.89);Difficulty in walking, not elsewhere classified (R26.2) ?Pain - part of body:  (back) ?  ? ? ?Time: 2984-7308 ?PT Time Calculation (min) (ACUTE ONLY): 16 min ? ?Charges:  $Gait Training: 8-22 mins          ?          ? ?Moishe Spice, PT, DPT ?Acute Rehabilitation Services  ?Pager: [phone] ?Office: [phone] ? ? ? ?Bonnie Robinson ?08/25/2021, 12:40 PM ? ?

## 2021-08-25 NOTE — TOC Initial Note (Signed)
Transition of Care (TOC) - Initial/Assessment Note  ? ? ?Patient Details  ?Name: Bonnie Robinson ?MRN: [id number] ?Date of Birth: 09/03/1950 ? ?Transition of Care (TOC) CM/SW Contact:    ?Pollie Friar, RN ?Phone Number: ?08/25/2021, 2:53 PM ? ?Clinical Narrative:                 ?Patient is from home with spouse. Plan is for surgery on Friday. Current recommendations are for home health services. Await PT/OT evals post surgery. Pt has no preference for home health agency.  ?Pt denies issues with home medications or transportation.  ?Has walker/ cane at home and elevated toilets.  ?TOC following. ? ?Expected Discharge Plan: [place] ?Barriers to Discharge: Continued Medical Work up ? ? ?Patient Goals and CMS Choice ?  ?CMS Medicare.gov Compare Post Acute Care list provided to:: Patient ?Choice offered to / list presented to : Patient ? ?Expected Discharge Plan and Services ?Expected Discharge Plan: [place] ?  ?Discharge Planning Services: CM Consult ?Post Acute Care Choice: Home Health ?Living arrangements for the past 2 months: [place] ?                ?  ?  ?  ?  ?  ?  ?  ?  ?  ?  ? ?Prior Living Arrangements/Services ?Living arrangements for the past 2 months: [place] ?Lives with:: Spouse ?Patient language and need for interpreter reviewed:: Yes ?Do you feel safe going back to the place where you live?: Yes      ?  ?Care giver support system in place?: Yes (comment) ?Current home services: DME (cane/ walker/ elevated toilet) ?Criminal Activity/Legal Involvement Pertinent to Current Situation/Hospitalization: No - Comment as needed ? ?Activities of Daily Living ?Home Assistive Devices/Equipment: None ?ADL Screening (condition at time of admission) ?Patient's cognitive ability adequate to safely complete daily activities?: Yes ?Is the patient deaf or have difficulty hearing?: No ?Does the patient have difficulty seeing, even when wearing glasses/contacts?:  No ?Does the patient have difficulty concentrating, remembering, or making decisions?: No ?Patient able to express need for assistance with ADLs?: No ?Does the patient have difficulty dressing or bathing?: No ?Independently performs ADLs?: Yes (appropriate for developmental age) ?Does the patient have difficulty walking or climbing stairs?: Yes ?Weakness of Legs: Both ?Weakness of Arms/Hands: None ? ?Permission Sought/Granted ?  ?  ?   ?   ?   ?   ? ?Emotional Assessment ?Appearance:: Appears stated age ?Attitude/Demeanor/Rapport: Engaged ?Affect (typically observed): Accepting ?Orientation: : Oriented to Self, Oriented to Place, Oriented to  Time, Oriented to Situation ?  ?Psych Involvement: No (comment) ? ?Admission diagnosis:  Bilateral sciatica [M54.31, M54.32] ?Weakness of both lower extremities [R29.898] ?Spondylolisthesis at L4-L5 level [M43.16] ?Patient Active Problem List  ? Diagnosis Date Noted  ? Spondylolisthesis at L4-L5 level 08/23/2021  ? ?PCP:  Kristopher Glee., MD ?Pharmacy:   ?[id number] [address] AT [facility] OF [address] ?[place] ?[place] [address] ?Phone: [phone] Fax: [phone] ? ? ? ? ?Social Determinants of Health (SDOH) Interventions ?  ? ?Readmission Risk Interventions ?   ? View : No data to display.  ?  ?  ?  ? ? ? ?

## 2021-08-25 NOTE — Progress Notes (Signed)
Subjective: ?Patient reports back and leg pain, walking no better, c/o neck pain which is chronic and has had PT, injections ? ?Objective: ?Vital signs in last 24 hours: ?Temp:  [97.7 ?F (36.5 ?C)-98.7 ?F (37.1 ?C)] 97.7 ?F (36.5 ?C) (04/12 8891) ?Pulse Rate:  [75-98] 78 (04/12 0728) ?Resp:  [15-19] 16 (04/12 0728) ?BP: (97-136)/(60-92) 124/84 (04/12 6945) ?SpO2:  [94 %-98 %] 98 % (04/12 0728) ? ?Intake/Output from previous day: ?04/11 0701 - 04/12 0700 ?In: 24 [P.O.:50] ?Out: -  ?Intake/Output this shift: ?Total I/O ?In: 3 [I.V.:3] ?Out: -  ? ?Neurologic: Grossly normal, gait is ataxic, has EHL weakness on left (0/5) ? ?Lab Results: ?Lab Results  ?Component Value Date  ? WBC 10.8 (H) 08/24/2021  ? HGB 13.3 08/24/2021  ? HCT 40.6 08/24/2021  ? MCV 89.8 08/24/2021  ? PLT 292 08/24/2021  ? ?No results found for: INR, PROTIME ?BMET ?Lab Results  ?Component Value Date  ? NA 139 08/24/2021  ? K 5.9 (H) 08/24/2021  ? CL 107 08/24/2021  ? CO2 24 08/24/2021  ? GLUCOSE 140 (H) 08/24/2021  ? BUN 27 (H) 08/24/2021  ? CREATININE 0.70 08/24/2021  ? CALCIUM 9.3 08/24/2021  ? ? ?Studies/Results: ?DG Lumbar Spine 2-3 Views ? ?Result Date: 08/23/2021 ?CLINICAL DATA:  Low back pain EXAM: LUMBAR SPINE - 2-3 VIEW COMPARISON:  08/19/2021 FINDINGS: Levoscoliosis. Five non rib-bearing lumbar type vertebra. Grade 1 anterolisthesis L4 on L5. Vertebral body heights are maintained. Moderate disc space narrowing at L1-L2 and L3-L4 with advanced disc space narrowing and degenerative change L2-L3. Mild degenerative changes L4 through S1. Facet degenerative changes of the lower lumbar spine. IMPRESSION: Scoliosis with degenerative change, most advanced at L2-L3 Electronically Signed   By: Donavan Foil M.D.   On: 08/23/2021 20:52  ? ?MR LUMBAR SPINE WO CONTRAST ? ?Result Date: 08/23/2021 ?CLINICAL DATA:  Low back pain radiating down both legs EXAM: MRI LUMBAR SPINE WITHOUT CONTRAST TECHNIQUE: Multiplanar, multisequence MR imaging of the lumbar  spine was performed. No intravenous contrast was administered. COMPARISON:  Lumbar spine radiographs 08/19/2021 FINDINGS: Segmentation: Standard; the lowest formed disc space is designated L5-S1. Alignment: There is levoscoliosis centered at L3. There is grade 1 anterolisthesis of L4 on L5, likely degenerative in nature. Vertebrae: Vertebral body heights are preserved. There is mild degenerative endplate marrow signal abnormality at L2-L3. There is no suspicious signal abnormality. There is no marrow edema. Conus medullaris and cauda equina: Conus extends to the L1-L2 level. Conus and cauda equina appear normal. Paraspinal and other soft tissues: T2 hyperintense renal lesions likely reflects cysts, for which no specific imaging follow-up is required. The paraspinal soft tissues are unremarkable. Disc levels: There is multilevel disc desiccation and narrowing, most advanced at L2-L3. There is multilevel facet arthropathy, most advanced at L4-L5 and L5-S1 T12-L1: There is a mild disc bulge and mild bilateral facet arthropathy without significant spinal canal or neural foraminal stenosis L1-L2: There is a mild disc bulge and right worse than left facet arthropathy resulting in mild narrowing of the right subarticular zone without evidence of nerve root impingement without significant neural foraminal stenosis L2-L3: There is a mild disc bulge and right worse than left facet arthropathy resulting in narrowing of the right subarticular zone with possible irritation of the traversing L3 nerve root and mild right worse than left neural foraminal stenosis. L3-L4: There is a diffuse disc bulge and bilateral facet arthropathy resulting in mild spinal canal stenosis with narrowing of the subarticular zones, left worse than  right, and mild bilateral neural foraminal stenosis L4-L5: There is grade 1 anterolisthesis with uncovering of the disc posteriorly and a superimposed mild bulge and severe bilateral facet arthropathy  resulting in severe spinal canal stenosis with impingement of the cauda equina nerve roots and mild-to-moderate left and mild right neural foraminal stenosis L5-S1: There is a mild disc bulge and moderate left worse than right facet arthropathy with trace effusions resulting in narrowing of the left subarticular zone with potential irritation of the traversing S1 nerve root and mild-to-moderate left and no significant right neural foraminal stenosis. IMPRESSION: 1. Grade 1 anterolisthesis of L4 on L5 with associated advanced facet arthropathy and disc bulge resulting in severe spinal canal stenosis with impingement of the cauda equina nerve roots and mild-to-moderate left and mild right neural foraminal stenosis. 2. Subarticular zone narrowing on the right at L1-L2 and L2-L3, bilaterally at L3-L4 (left worse than right), and on the left at L5-S1 with potential irritation of the traversing nerve roots. There is also mild-to-moderate left neural foraminal stenosis at L5-S1. 3. Levoscoliosis centered at L3. Electronically Signed   By: Valetta Mole M.D.   On: 08/23/2021 14:44   ? ?Assessment/Plan: ?Plan is for PLIF L4-5 Friday, she understands the risks of the surgery include but are not limited to bleeding, infection, nerve root injury, CSF leak, numbness, weakness, loss of bowel bladder or sexual function, lack of relief of symptoms, worsening symptoms, misplaced or malfunction of instrumentation, pseudo arthrosis, adjacent level disease, need for further surgery, and anesthesia risk including DVT pneumonia MI and death.  She agrees to proceed. ? ?Mri c-spine today to rule out cord compression as etiology of gait disturbance ? ?Estimated body mass index is 30.36 kg/m? as calculated from the following: ?  Height as of this encounter: '5\' 2"'$  (1.575 m). ?  Weight as of this encounter: 75.3 kg. ? ? ? LOS: 2 days  ? ? ?Eustace Moore ?08/25/2021, 8:52 AM ? ? ? ? ?

## 2021-08-25 NOTE — Evaluation (Signed)
Occupational Therapy Evaluation Patient Details Name: Bonnie Robinson MRN: [id number] DOB: Aug 13, 1950 Today's Date: 08/25/2021   History of Present Illness Patient is a 71 y.o. female admitted 4/10 who complains of back pain and gait disturbance. Onset of symptoms was 1 week ago, stable since that time. The patient saw her primary care physician has tried gabapentin and steroids and muscle relaxant. MRI showed severe spinal stenosis at L4-5 with a grade 1 spondylolisthesis.  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  She had an MRI and a CT scan of the cervical spine 6 months ago   Clinical Impression   Pt admitted for concerns listed above. PTA pt reported that she was independent with all ADL's and IADL's, using no DME until 3 weeks ago when her back and BLE started to ache and she had difficulty ambulating. At this time, pt demonstrates need for min guard assist for ADL's and functional mobility. She demonstrates good understanding and carry over of compensatory strategies and spinal precautions. OT will follow up after surgery to reassess and determine if pt's needs change.       Recommendations for follow up therapy are one component of a multi-disciplinary discharge planning process, led by the attending physician.  Recommendations may be updated based on patient status, additional functional criteria and insurance authorization.   Follow Up Recommendations  No OT follow up (pending pt disposition s/p surgery)    Assistance Recommended at Discharge Set up Supervision/Assistance  Patient can return home with the following A little help with walking and/or transfers;A little help with bathing/dressing/bathroom;Assistance with cooking/housework    Functional Status Assessment  Patient has had a recent decline in their functional status and demonstrates the ability to make  significant improvements in function in a reasonable and predictable amount of time.  Equipment Recommendations  None recommended by OT    Recommendations for Other Services       Precautions / Restrictions Precautions Precautions: Back Precaution Booklet Issued: No Precaution Comments: Pre-education for surgery Restrictions Weight Bearing Restrictions: No      Mobility Bed Mobility               General bed mobility comments: up in recliner, verbally reviewed log roll    Transfers Overall transfer level: Needs assistance Equipment used: Rolling walker (2 wheels) Transfers: Sit to/from Stand Sit to Stand: Min guard           General transfer comment: Requiring brief education on RW safety      Balance Overall balance assessment: Needs assistance Sitting-balance support: No upper extremity supported, Feet supported Sitting balance-Leahy Scale: Good     Standing balance support: No upper extremity supported, Single extremity supported, Bilateral upper extremity supported, During functional activity Standing balance-Leahy Scale: Fair Standing balance comment: Able to stand without UE support but benefits from UE support for standing mobility stability                           ADL either performed or assessed with clinical judgement   ADL Overall ADL's : Needs assistance/impaired                                       General ADL Comments: Min guard with all ADL's at this time, reviewed  compensatory strategies to lessen back pain and prepare for when pt will be under spinal precautions s/p surgery     Vision Baseline Vision/History: 1 Wears glasses Ability to See in Adequate Light: 0 Adequate Patient Visual Report: No change from baseline Vision Assessment?: No apparent visual deficits     Perception     Praxis      Pertinent Vitals/Pain Pain Assessment Pain Assessment: 0-10 Pain Score: 5  Pain Location: low back and  neck Pain Descriptors / Indicators: Discomfort, Guarding Pain Intervention(s): Limited activity within patient's tolerance, Monitored during session, Repositioned     Hand Dominance Right   Extremity/Trunk Assessment Upper Extremity Assessment Upper Extremity Assessment: Overall WFL for tasks assessed   Lower Extremity Assessment Lower Extremity Assessment: Defer to PT evaluation   Cervical / Trunk Assessment Cervical / Trunk Assessment: Normal   Communication Communication Communication: No difficulties   Cognition Arousal/Alertness: Awake/alert Behavior During Therapy: WFL for tasks assessed/performed Overall Cognitive Status: Within Functional Limits for tasks assessed                                       General Comments  VSS on RA, daugher and husband present and supportive    Exercises     Shoulder Instructions      Home Living Family/patient expects to be discharged to:: Private residence Living Arrangements: Spouse/significant other Available Help at Discharge: Family;Available 24 hours/day Type of Home: House Home Access: Stairs to enter [employer] of Steps: 2 Entrance Stairs-Rails: Right Home Layout: Able to live on main level with bedroom/bathroom;Full bath on main level;Two level     Bathroom Shower/Tub: Producer, television/film/video: Handicapped height     Home Equipment: Shower seat - built [employer] fundraiser (2 wheels);Hand held shower head;Standard Walker          Prior Functioning/Environment Prior Level of Function : Driving;Independent/Modified Independent             Mobility Comments: recently began using walker for safety due to pain ADLs Comments: I per pt        OT Problem List: Decreased strength;Decreased range of motion;Decreased activity tolerance;Impaired balance (sitting and/or standing);Pain      OT Treatment/Interventions: Self-care/ADL training;Therapeutic exercise;Energy  conservation;DME and/or AE instruction;Therapeutic activities;Patient/family education;Balance training    OT Goals(Current goals can be found in the care plan section) Acute Rehab OT Goals Patient Stated Goal: To lessen pain OT Goal Formulation: With patient Time For Goal Achievement: 09/08/21 Potential to Achieve Goals: Good ADL Goals Pt Will Perform Grooming: Independently;standing Pt Will Perform Lower Body Bathing: with modified independence;sitting/lateral leans;with adaptive equipment;sit to/from stand Pt Will Perform Lower Body Dressing: with modified independence;with adaptive equipment;sitting/lateral leans;sit to/from stand Pt Will Transfer to Toilet: with modified independence;ambulating Pt Will Perform Toileting - Clothing Manipulation and hygiene: with modified independence;sitting/lateral leans;sit to/from stand;with adaptive equipment Additional ADL Goal #1: Pt will verbalize and demonstrate good understanding of 3/3 spinal precautions 100% of the time.  OT Frequency: Min 2X/week    Co-evaluation              AM-PAC OT "6 Clicks" Daily Activity     Outcome Measure Help from another person eating meals?: None Help from another person taking care of personal grooming?: None Help from another person toileting, which includes using toliet, bedpan, or urinal?: A Little Help from another person bathing (including washing, rinsing, drying)?: A  Little Help from another person to put on and taking off regular upper body clothing?: None Help from another person to put on and taking off regular lower body clothing?: A Little 6 Click Score: 21   End of Session Equipment Utilized During Treatment: Rolling walker (2 wheels) Nurse Communication: Mobility status  Activity Tolerance: Patient tolerated treatment well Patient left: in chair;with call bell/phone within reach;with family/visitor present  OT Visit Diagnosis: Unsteadiness on feet (R26.81);Other abnormalities of gait  and mobility (R26.89);Muscle weakness (generalized) (M62.81)                Time: 1331-1400 OT Time Calculation (min): 29 min Charges:  OT General Charges $OT Visit: 1 Visit OT Evaluation $OT Eval Moderate Complexity: 1 Mod OT Treatments $Self Care/Home Management : 8-22 mins  Khaleah Duer H., OTR/L Acute Rehabilitation  Nihal Marzella Elane Bing Plume 08/25/2021, 4:17 PM

## 2021-08-26 NOTE — Progress Notes (Signed)
Subjective: ?Patient reports continued back pain with bilateral leg pain with walking and difficulty with gait. ? ?Objective: ?Vital signs in last 24 hours: ?Temp:  [97.9 ?F (36.6 ?C)-98.5 ?F (36.9 ?C)] 97.9 ?F (36.6 ?C) (04/13 3151) ?Pulse Rate:  [64-90] 65 (04/13 0723) ?Resp:  [16-18] 18 (04/13 0723) ?BP: (112-141)/(52-92) 139/84 (04/13 0723) ?SpO2:  [97 %-99 %] 99 % (04/13 0723) ? ?Intake/Output from previous day: ?04/12 0701 - 04/13 0700 ?In: 3 [I.V.:3] ?Out: -  ?Intake/Output this shift: ?No intake/output data recorded. ? ?Neurologic: Grossly normal except continued antalgic gait, weakness of the left EHL, negative Hoffmann sign, reflexes 1+. ? ?Lab Results: ?Lab Results  ?Component Value Date  ? WBC 10.8 (H) 08/24/2021  ? HGB 13.3 08/24/2021  ? HCT 40.6 08/24/2021  ? MCV 89.8 08/24/2021  ? PLT 292 08/24/2021  ? ?No results found for: INR, PROTIME ?BMET ?Lab Results  ?Component Value Date  ? NA 142 08/25/2021  ? K 4.6 08/25/2021  ? CL 107 08/25/2021  ? CO2 28 08/25/2021  ? GLUCOSE 125 (H) 08/25/2021  ? BUN 25 (H) 08/25/2021  ? CREATININE 0.68 08/25/2021  ? CALCIUM 9.2 08/25/2021  ? ? ?Studies/Results: ?MR CERVICAL SPINE WO CONTRAST ? ?Result Date: 08/25/2021 ?CLINICAL DATA:  71 year old female with chronic neck pain and gait disturbance. C4-C5 cervical fusion on CT last year. EXAM: MRI CERVICAL SPINE WITHOUT CONTRAST TECHNIQUE: Multiplanar, multisequence MR imaging of the cervical spine was performed. No intravenous contrast was administered. COMPARISON:  Cervical spine CT 10/21/2020.  MRI 10/20/2020. FINDINGS: Alignment: Stable from last year, exaggerated lower cervical lordosis and straightening of upper cervical lordosis with mild degenerative anterolisthesis of C3 on C4, C7 on T1. Vertebrae: Ongoing degenerative appearing marrow edema in the left C1 and C2 vertebrae, with severe joint space loss there by CT last year (bone-on-bone appearance and vacuum phenomena). Superimposed degenerative appearing marrow  heterogeneity in the odontoid, with regressed odontoid edema since last year. Chronic C4-C5 interbody ankylosis. Degenerative endplate marrow signal changes elsewhere with normal background bone marrow signal. Cord: Chronic cord compression at C4-C5 (series 6, image 13), although no definite associated abnormal cord signal and stable appearance to the MRI last year. Above and below that level no abnormal cord signal despite some additional degenerative cord mass effect. Visible upper thoracic spinal cord appears within normal limits. Posterior Fossa, vertebral arteries, paraspinal tissues: Cervicomedullary junction is within normal limits. Patchy left posterior cerebellar chronic encephalomalacia is visible (series 2, image 11). Otherwise grossly negative visible posterior fossa and brain parenchyma. Major vascular flow voids in the neck appear preserved. Negative visible neck soft tissues and lung apices. Disc levels: C2-C3:  Negative. C3-C4: Anterolisthesis with moderate to severe disc space loss, right eccentric disc osteophyte complex, and severe right facet hypertrophy. Mild spinal stenosis and spinal cord mass effect does appear increased from last year on series 6, image 8. Moderate left and severe right C4 foraminal stenosis is stable. C4-C5: Chronic interbody ankylosis. But residual endplate spurring, and ligament flavum hypertrophy at the C5 level result in up to moderate spinal stenosis and spinal cord mass effect on series 2, image 7 and series 6, image 13. Mild to moderate C5 foraminal stenosis mostly on the left appears stable. C5-C6: Chronic disc space loss and circumferential disc osteophyte complex. Ligament flavum and facet hypertrophy. Mild spinal stenosis and spinal cord mass effect are stable to increased. Mild to moderate left and moderate to severe right C6 foraminal stenosis is stable. C6-C7: Lesser disc bulging and endplate spurring  here. Mild to moderate facet hypertrophy. No spinal  stenosis. Mild to moderate right C7 foraminal stenosis is stable. C7-T1: Chronic anterolisthesis with disc bulging, moderate to severe facet and ligament flavum hypertrophy. No spinal stenosis. Mild bilateral C8 foraminal stenosis is stable. IMPRESSION: 1. Up to moderate degenerative spinal stenosis and spinal cord mass effect at C4-C5 does not appear significantly changed from the MRI last year, with cord compression but no definite abnormal cord signal. Underlying interbody ankylosis there but endplate spurring and posterior ligamentous hypertrophy contribute to stenosis. 2. Advanced cervical spine degeneration elsewhere, with mild progression of chronic multifactorial spinal stenosis and spinal cord mass at C3-C4, C5-C6. Chronic moderate or severe bilateral C4, right C6 neural foraminal stenosis. 3. Chronic severe C1-C2 joint space loss on the left with ongoing associated marrow edema. 4. 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 Electronically Signed   By: Genevie Ann M.D.   On: 08/25/2021 10:36   ? ?Assessment/Plan: ?Spondylolisthesis with stenosis L4-5 -she is scheduled for decompression and posterior lumbar interbody fusion at L4-5 tomorrow.  She does have moderate spinal stenosis at C4-5 without signal change in the cord and she has no evidence of myelopathy on exam, therefore I believe she is safe for the prone position for surgery.  I have explained this to her in detail.  I explained the surgery to her in detail once again.  We talked about typical outcomes and recovery times.  Questions have been encouraged and answered.  N.p.o. after midnight. ? ?Estimated body mass index is 30.36 kg/m? as calculated from the following: ?  Height as of this encounter: '5\' 2"'$  (1.575 m). ?  Weight as of this encounter: 75.3 kg. ? ? ? LOS: 3 days  ? ? ?Eustace Moore ?08/26/2021, 8:01 AM ? ? ? ? ?

## 2021-08-26 NOTE — Anesthesia Preprocedure Evaluation (Addendum)
Anesthesia Evaluation  ?Patient identified by MRN, date of birth, ID band ?Patient awake ? ? ? ?Reviewed: ?Allergy & Precautions, NPO status , Patient's Chart, lab work & pertinent test results ? ?History of Anesthesia Complications ?Negative for: history of anesthetic complications ? ?Airway ?Mallampati: I ? ?TM Distance: >3 FB ?Neck ROM: Full ? ? ? Dental ? ?(+) Dental Advisory Given, Teeth Intact ?  ?Pulmonary ?neg pulmonary ROS,  ?  ?Pulmonary exam normal ? ? ? ? ? ? ? Cardiovascular ?negative cardio ROS ?Normal cardiovascular exam ? ? ?  ?Neuro/Psych ?negative neurological ROS ? negative psych ROS  ? GI/Hepatic ?negative GI ROS, Neg liver ROS,   ?Endo/Other  ? ?Obesity ? ? Renal/GU ?negative Renal ROS  ? ?  ?Musculoskeletal ?negative musculoskeletal ROS ?(+)  ? Abdominal ?  ?Peds ? Hematology ?negative hematology ROS ?(+)   ?Anesthesia Other Findings ? ? Reproductive/Obstetrics ? ?  ? ? ? ? ? ? ? ? ? ? ? ? ? ?  ?  ? ? ? ? ? ? ? ?Anesthesia Physical ?Anesthesia Plan ? ?ASA: 2 ? ?Anesthesia Plan: General  ? ?Post-op Pain Management: Tylenol PO (pre-op)*  ? ?Induction: Intravenous ? ?PONV Risk Score and Plan: 4 or greater and Treatment may vary due to age or medical condition, Ondansetron, Midazolam and Dexamethasone ? ?Airway Management Planned: Oral ETT ? ?Additional Equipment: None ? ?Intra-op Plan:  ? ?Post-operative Plan: Extubation in OR ? ?Informed Consent: I have reviewed the patients History and Physical, chart, labs and discussed the procedure including the risks, benefits and alternatives for the proposed anesthesia with the patient or authorized representative who has indicated his/her understanding and acceptance.  ? ? ? ?Dental advisory given ? ?Plan Discussed with: CRNA and Anesthesiologist ? ?Anesthesia Plan Comments:   ? ? ? ? ? ?Anesthesia Quick Evaluation ? ?

## 2021-08-26 NOTE — Care Management Important Message (Signed)
Important Message ? ?Patient Details  ?Name: Bonnie Robinson ?MRN: [id number] ?Date of Birth: 13-Jan-1951 ? ? ?Medicare Important Message Given:  Yes ? ? ? ? ?Janeah Kovacich ?08/26/2021, 3:04 PM ?

## 2021-08-26 NOTE — Plan of Care (Signed)
  Problem: Education: Goal: Knowledge of General Education information will improve Description: Including pain rating scale, medication(s)/side effects and non-pharmacologic comfort measures Outcome: Progressing   Problem: Pain Managment: Goal: General experience of comfort will improve Outcome: Progressing   Problem: Safety: Goal: Ability to remain free from injury will improve Outcome: Progressing   

## 2021-08-27 ENCOUNTER — Encounter (HOSPITAL_COMMUNITY): Payer: Self-pay | Admitting: Neurological Surgery

## 2021-08-27 ENCOUNTER — Inpatient Hospital Stay (HOSPITAL_COMMUNITY): Payer: PPO | Admitting: Anesthesiology

## 2021-08-27 ENCOUNTER — Encounter (HOSPITAL_COMMUNITY)
Admission: EM | Disposition: A | Discharge status: Home / Self Care | Payer: Self-pay | Source: Home / Self Care | Attending: Neurological Surgery

## 2021-08-27 ENCOUNTER — Inpatient Hospital Stay (HOSPITAL_COMMUNITY): Payer: PPO

## 2021-08-27 ENCOUNTER — Other Ambulatory Visit: Payer: Self-pay

## 2021-08-27 DIAGNOSIS — M48061 Spinal stenosis, lumbar region without neurogenic claudication: Secondary | ICD-10-CM

## 2021-08-27 DIAGNOSIS — M4316 Spondylolisthesis, lumbar region: Secondary | ICD-10-CM

## 2021-08-27 DIAGNOSIS — Z981 Arthrodesis status: Secondary | ICD-10-CM

## 2021-08-27 DIAGNOSIS — M5416 Radiculopathy, lumbar region: Secondary | ICD-10-CM

## 2021-08-27 DIAGNOSIS — M7138 Other bursal cyst, other site: Secondary | ICD-10-CM

## 2021-08-27 HISTORY — DX: Arthrodesis status: Z98.1

## 2021-08-27 LAB — SURGICAL PCR SCREEN
MRSA, PCR: NEGATIVE
Staphylococcus aureus: NEGATIVE

## 2021-08-27 LAB — TYPE AND SCREEN
ABO/RH(D): A POS
Antibody Screen: NEGATIVE

## 2021-08-27 LAB — ABO/RH: ABO/RH(D): A POS

## 2021-08-27 IMAGING — RF DG LUMBAR SPINE 2-3V
1 series · 5 of 5 positions shown · non-contrast
Comparison: None.

FLUOROSCOPY:
Air kerma 43.15 mGy

CLINICAL DATA: L4-L5 lumbar interbody fusion, fluoroscopic
assistance

EXAM:
LUMBAR SPINE - 2-3 VIEW; DG C-ARM 1-60 MIN-NO REPORT

[Series 1: run · 5 of 5 slices shown]
[im 1/5]
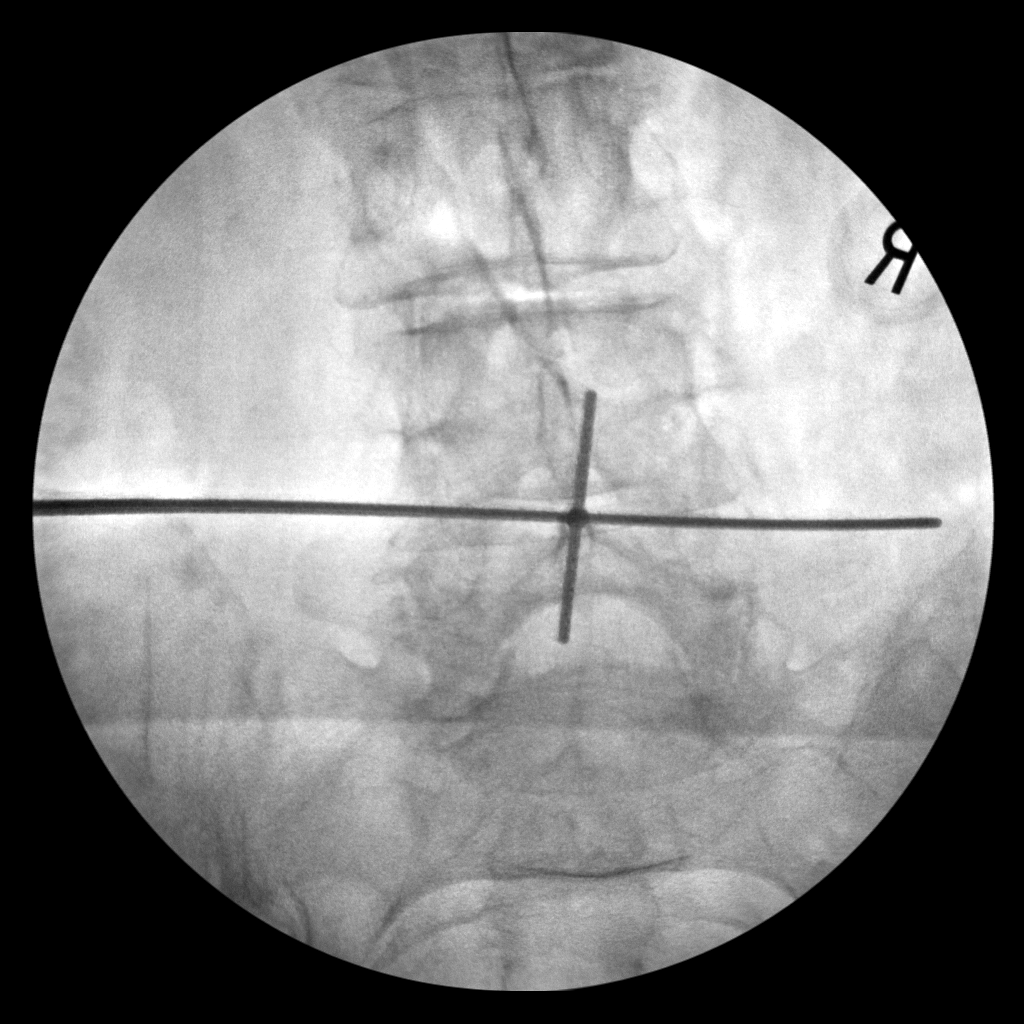
[im 2/5]
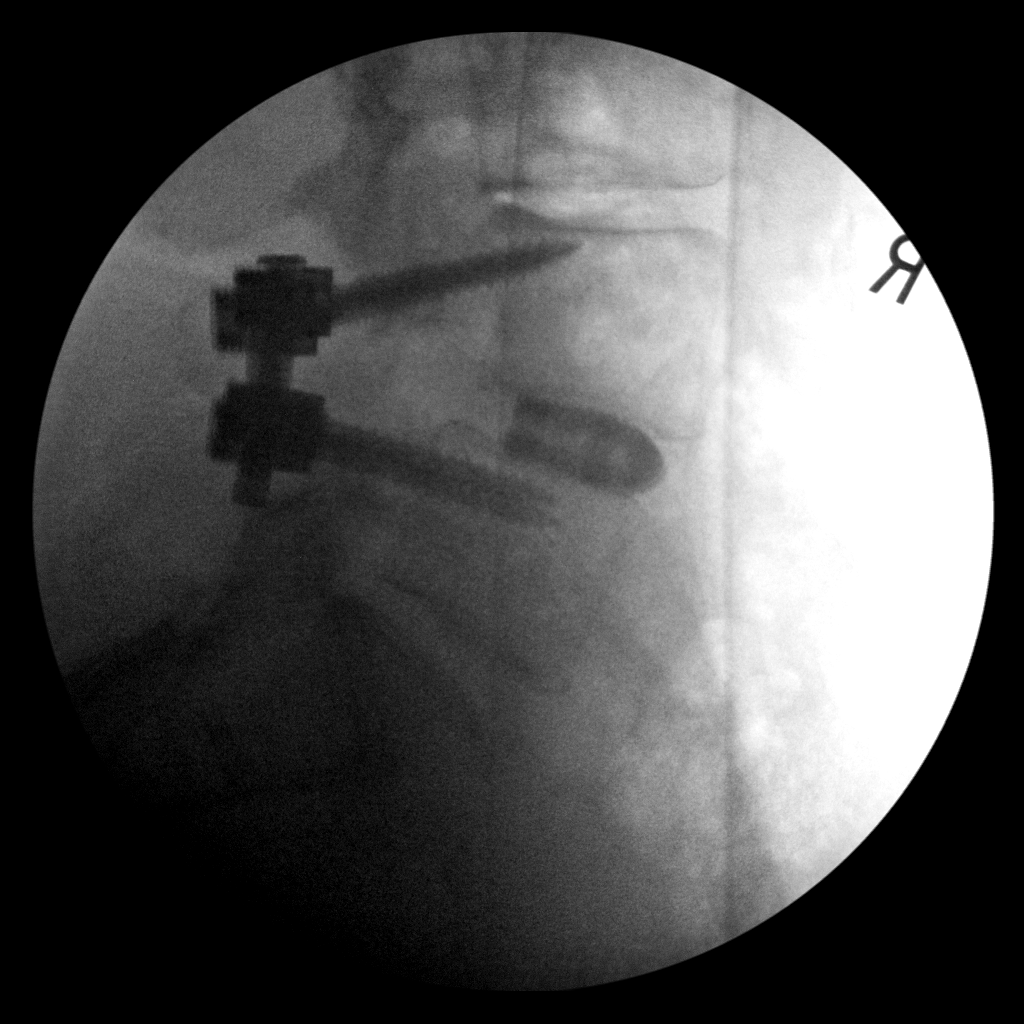
[im 3/5]
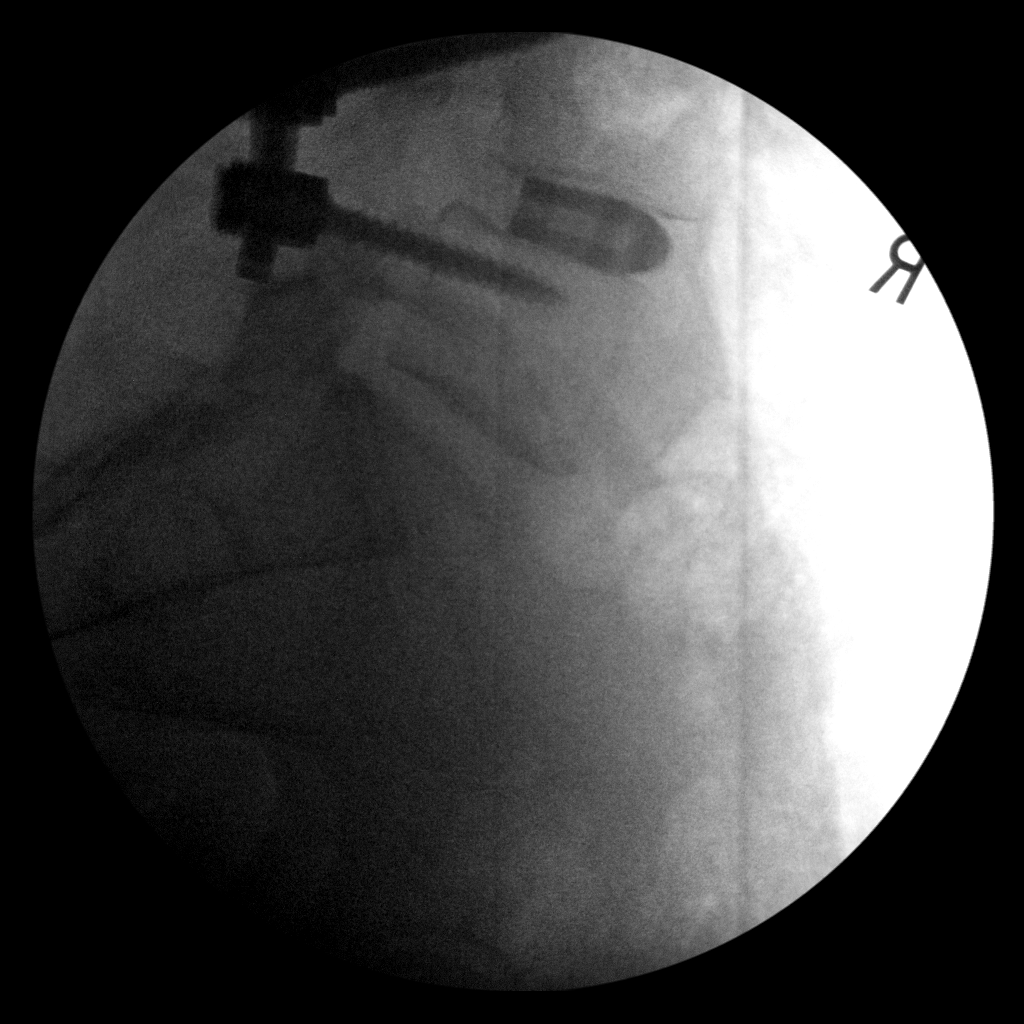
[im 4/5]
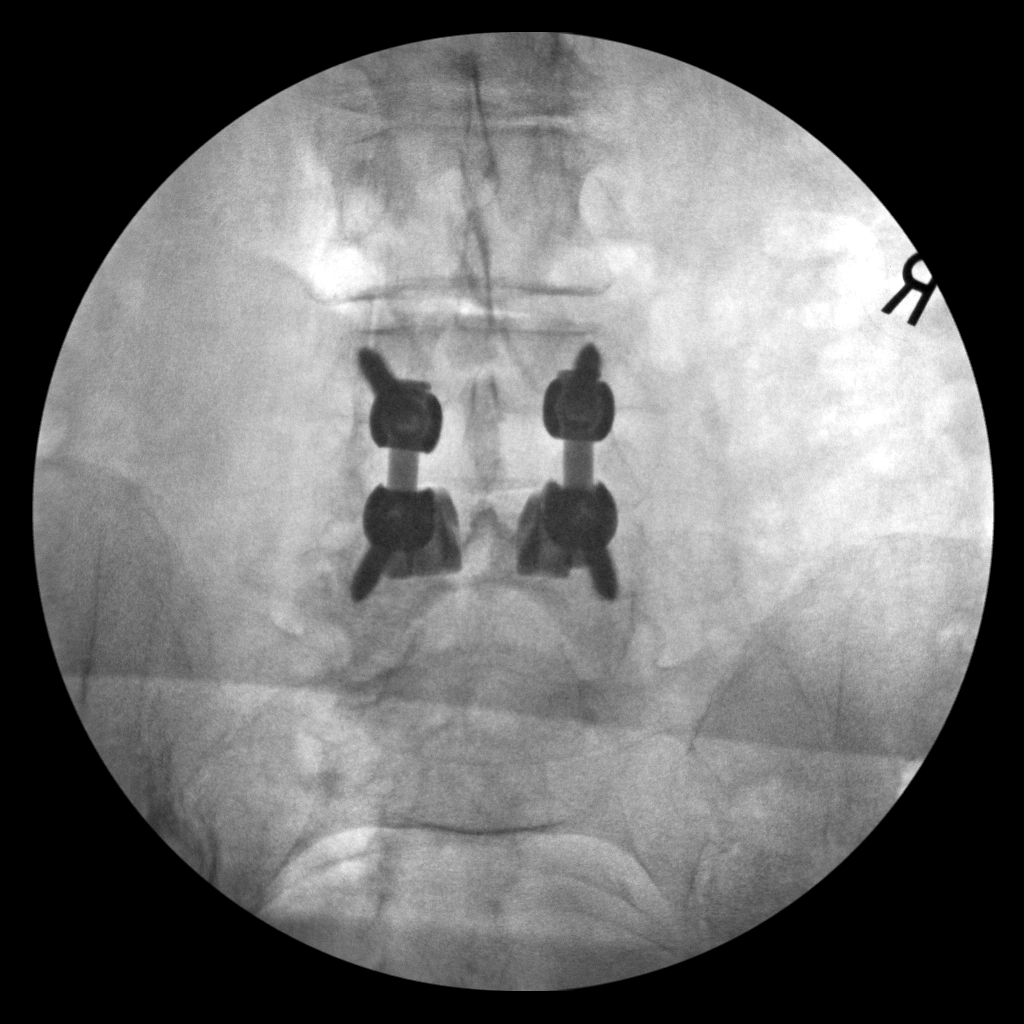
[im 5/5]
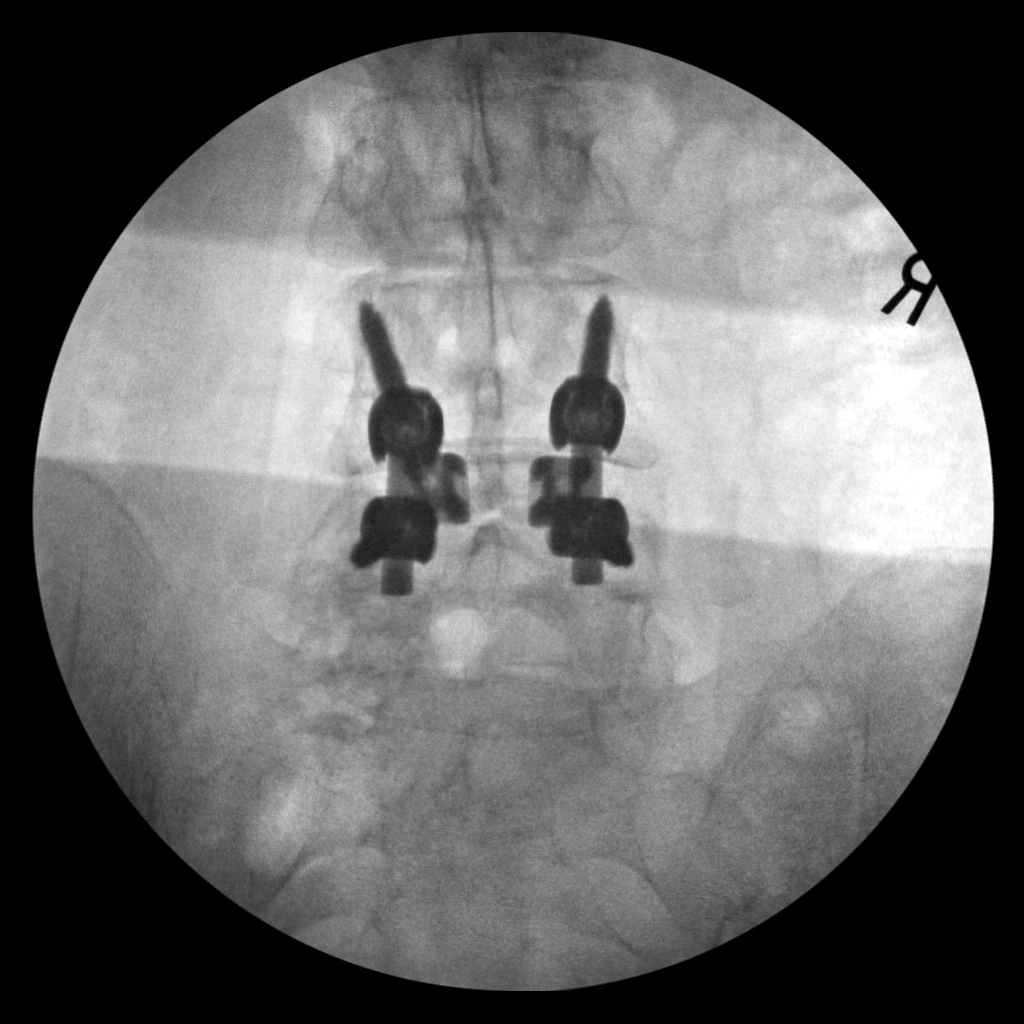

[5 of 5 positions shown; findings below may reference images not displayed]

FINDINGS: Intraoperative fluoroscopic images of the lumbar spine demonstrate
posterior discectomy and fusion of L4-L5. No obvious perihardware
fracture or component malpositioning.
IMPRESSION: Intraoperative fluoroscopic images of the lumbar spine demonstrate
posterior discectomy and fusion of L4-L5. No obvious perihardware
fracture or component malpositioning.

## 2021-08-27 SURGERY — POSTERIOR LUMBAR FUSION 1 LEVEL
Anesthesia: General | Site: Back

## 2021-08-27 MED ORDER — SODIUM CHLORIDE 0.9% FLUSH: 3.0000 mL | INTRAVENOUS | Status: DC | PRN

## 2021-08-27 MED ORDER — LIDOCAINE 2% (20 MG/ML) 5 ML SYRINGE
INTRAMUSCULAR | Status: DC | PRN
  Administered 2021-08-27: 100 mg via INTRAVENOUS

## 2021-08-27 MED ORDER — METHOCARBAMOL 1000 MG/10ML IJ SOLN: 500.0000 mg | Freq: Four times a day (QID) | INTRAVENOUS | Status: DC | PRN

## 2021-08-27 MED ORDER — SODIUM CHLORIDE 0.9% FLUSH
3.0000 mL | Freq: Two times a day (BID) | INTRAVENOUS | Status: DC
  Administered 2021-08-27 – 2021-08-28 (×2): 3 mL via INTRAVENOUS

## 2021-08-27 MED ORDER — ASPIRIN 325 MG PO TABS: 975.0000 mg | ORAL_TABLET | Freq: Every day | ORAL | Status: DC | PRN

## 2021-08-27 MED ORDER — MORPHINE SULFATE (PF) 2 MG/ML IV SOLN: 2.0000 mg | INTRAVENOUS | Status: DC | PRN

## 2021-08-27 MED ORDER — PANTOPRAZOLE SODIUM 40 MG IV SOLR
40.0000 mg | Freq: Every day | INTRAVENOUS | Status: DC
  Administered 2021-08-27: 40 mg via INTRAVENOUS
  Filled 2021-08-27: qty 10

## 2021-08-27 MED ORDER — CEFAZOLIN SODIUM-DEXTROSE 2-4 GM/100ML-% IV SOLN
INTRAVENOUS | Status: AC
  Filled 2021-08-27: qty 100

## 2021-08-27 MED ORDER — PROPOFOL 10 MG/ML IV BOLUS
INTRAVENOUS | Status: AC
  Filled 2021-08-27: qty 20

## 2021-08-27 MED ORDER — MIDAZOLAM HCL 2 MG/2ML IJ SOLN
INTRAMUSCULAR | Status: DC | PRN
  Administered 2021-08-27: 2 mg via INTRAVENOUS

## 2021-08-27 MED ORDER — OXYCODONE HCL 5 MG/5ML PO SOLN: 5.0000 mg | Freq: Once | ORAL | Status: DC | PRN

## 2021-08-27 MED ORDER — ADULT MULTIVITAMIN W/MINERALS CH
1.0000 | ORAL_TABLET | Freq: Every day | ORAL | Status: DC
  Administered 2021-08-27 – 2021-08-29 (×3): 1 via ORAL
  Filled 2021-08-27 (×3): qty 1

## 2021-08-27 MED ORDER — ACETAMINOPHEN 500 MG PO TABS
1000.0000 mg | ORAL_TABLET | Freq: Once | ORAL | Status: AC
  Administered 2021-08-27: 1000 mg via ORAL
  Filled 2021-08-27: qty 2

## 2021-08-27 MED ORDER — MENTHOL 3 MG MT LOZG
1.0000 | LOZENGE | OROMUCOSAL | Status: DC | PRN
  Filled 2021-08-27: qty 9

## 2021-08-27 MED ORDER — OXYCODONE HCL 5 MG PO TABS
10.0000 mg | ORAL_TABLET | ORAL | Status: DC | PRN
  Administered 2021-08-27 – 2021-08-29 (×7): 10 mg via ORAL
  Filled 2021-08-27 (×7): qty 2

## 2021-08-27 MED ORDER — FENTANYL CITRATE (PF) 250 MCG/5ML IJ SOLN
INTRAMUSCULAR | Status: DC | PRN
  Administered 2021-08-27 (×2): 50 ug via INTRAVENOUS
  Administered 2021-08-27: 100 ug via INTRAVENOUS
  Administered 2021-08-27: 50 ug via INTRAVENOUS

## 2021-08-27 MED ORDER — PROPOFOL 10 MG/ML IV BOLUS
INTRAVENOUS | Status: DC | PRN
  Administered 2021-08-27: 200 mg via INTRAVENOUS

## 2021-08-27 MED ORDER — MIDAZOLAM HCL 2 MG/2ML IJ SOLN
INTRAMUSCULAR | Status: AC
  Filled 2021-08-27: qty 2

## 2021-08-27 MED ORDER — OXYCODONE HCL 5 MG PO TABS: 5.0000 mg | ORAL_TABLET | Freq: Once | ORAL | Status: DC | PRN

## 2021-08-27 MED ORDER — ONDANSETRON HCL 4 MG PO TABS: 4.0000 mg | ORAL_TABLET | Freq: Four times a day (QID) | ORAL | Status: DC | PRN

## 2021-08-27 MED ORDER — SENNA 8.6 MG PO TABS
1.0000 | ORAL_TABLET | Freq: Two times a day (BID) | ORAL | Status: DC
  Administered 2021-08-27 – 2021-08-29 (×5): 8.6 mg via ORAL
  Filled 2021-08-27 (×5): qty 1

## 2021-08-27 MED ORDER — THROMBIN 5000 UNITS EX SOLR
CUTANEOUS | Status: AC
  Filled 2021-08-27: qty 5000

## 2021-08-27 MED ORDER — POTASSIUM CHLORIDE IN NACL 20-0.9 MEQ/L-% IV SOLN
INTRAVENOUS | Status: DC
  Filled 2021-08-27: qty 1000

## 2021-08-27 MED ORDER — CHLORHEXIDINE GLUCONATE 0.12 % MT SOLN
15.0000 mL | Freq: Once | OROMUCOSAL | Status: AC
  Administered 2021-08-27: 15 mL via OROMUCOSAL
  Filled 2021-08-27: qty 15

## 2021-08-27 MED ORDER — FENTANYL CITRATE (PF) 250 MCG/5ML IJ SOLN
INTRAMUSCULAR | Status: AC
  Filled 2021-08-27: qty 5

## 2021-08-27 MED ORDER — DEXAMETHASONE SODIUM PHOSPHATE 10 MG/ML IJ SOLN
INTRAMUSCULAR | Status: DC | PRN
  Administered 2021-08-27: 10 mg via INTRAVENOUS

## 2021-08-27 MED ORDER — CEFAZOLIN SODIUM-DEXTROSE 2-3 GM-%(50ML) IV SOLR
INTRAVENOUS | Status: DC | PRN
  Administered 2021-08-27: 2 g via INTRAVENOUS

## 2021-08-27 MED ORDER — ACETAMINOPHEN 500 MG PO TABS
1000.0000 mg | ORAL_TABLET | Freq: Four times a day (QID) | ORAL | Status: DC
  Administered 2021-08-27 – 2021-08-28 (×3): 1000 mg via ORAL
  Filled 2021-08-27 (×3): qty 2

## 2021-08-27 MED ORDER — FENTANYL CITRATE (PF) 100 MCG/2ML IJ SOLN
25.0000 ug | INTRAMUSCULAR | Status: DC | PRN
  Administered 2021-08-27: 25 ug via INTRAVENOUS

## 2021-08-27 MED ORDER — SUGAMMADEX SODIUM 200 MG/2ML IV SOLN
INTRAVENOUS | Status: DC | PRN
  Administered 2021-08-27: 200 mg via INTRAVENOUS

## 2021-08-27 MED ORDER — 0.9 % SODIUM CHLORIDE (POUR BTL) OPTIME
TOPICAL | Status: DC | PRN
  Administered 2021-08-27: 1000 mL

## 2021-08-27 MED ORDER — DEXAMETHASONE SODIUM PHOSPHATE 4 MG/ML IJ SOLN: 4.0000 mg | Freq: Four times a day (QID) | INTRAMUSCULAR | Status: DC

## 2021-08-27 MED ORDER — FENTANYL CITRATE (PF) 100 MCG/2ML IJ SOLN
INTRAMUSCULAR | Status: AC
  Administered 2021-08-27: 25 ug via INTRAVENOUS
  Filled 2021-08-27: qty 2

## 2021-08-27 MED ORDER — ONDANSETRON HCL 4 MG/2ML IJ SOLN: 4.0000 mg | Freq: Once | INTRAMUSCULAR | Status: DC | PRN

## 2021-08-27 MED ORDER — PHENOL 1.4 % MT LIQD: 1.0000 | OROMUCOSAL | Status: DC | PRN

## 2021-08-27 MED ORDER — ONDANSETRON HCL 4 MG/2ML IJ SOLN: 4.0000 mg | Freq: Four times a day (QID) | INTRAMUSCULAR | Status: DC | PRN

## 2021-08-27 MED ORDER — BUPIVACAINE HCL (PF) 0.5 % IJ SOLN
INTRAMUSCULAR | Status: AC
  Filled 2021-08-27: qty 30

## 2021-08-27 MED ORDER — SODIUM CHLORIDE 0.9 % IV SOLN: 250.0000 mL | INTRAVENOUS | Status: DC

## 2021-08-27 MED ORDER — THROMBIN 5000 UNITS EX SOLR
OROMUCOSAL | Status: DC | PRN
  Administered 2021-08-27: 5 mL via TOPICAL

## 2021-08-27 MED ORDER — ORAL CARE MOUTH RINSE: 15.0000 mL | Freq: Once | OROMUCOSAL | Status: AC

## 2021-08-27 MED ORDER — METHOCARBAMOL 500 MG PO TABS: 500.0000 mg | ORAL_TABLET | Freq: Four times a day (QID) | ORAL | Status: DC | PRN

## 2021-08-27 MED ORDER — PHENYLEPHRINE HCL-NACL 20-0.9 MG/250ML-% IV SOLN: INTRAVENOUS | Status: DC | PRN

## 2021-08-27 MED ORDER — LACTATED RINGERS IV SOLN: INTRAVENOUS | Status: DC

## 2021-08-27 MED ORDER — DEXAMETHASONE 4 MG PO TABS
4.0000 mg | ORAL_TABLET | Freq: Four times a day (QID) | ORAL | Status: DC
  Administered 2021-08-27 – 2021-08-28 (×4): 4 mg via ORAL
  Filled 2021-08-27 (×4): qty 1

## 2021-08-27 MED ORDER — EPHEDRINE SULFATE-NACL 50-0.9 MG/10ML-% IV SOSY
PREFILLED_SYRINGE | INTRAVENOUS | Status: DC | PRN
  Administered 2021-08-27: 5 mg via INTRAVENOUS

## 2021-08-27 MED ORDER — ONDANSETRON HCL 4 MG/2ML IJ SOLN
INTRAMUSCULAR | Status: DC | PRN
  Administered 2021-08-27: 4 mg via INTRAVENOUS

## 2021-08-27 MED ORDER — PROPOFOL 10 MG/ML IV BOLUS
INTRAVENOUS | Status: AC
Start: 2021-08-27 — End: ?
  Filled 2021-08-27: qty 20

## 2021-08-27 MED ORDER — ROCURONIUM BROMIDE 10 MG/ML (PF) SYRINGE
PREFILLED_SYRINGE | INTRAVENOUS | Status: DC | PRN
  Administered 2021-08-27: 20 mg via INTRAVENOUS
  Administered 2021-08-27: 50 mg via INTRAVENOUS
  Administered 2021-08-27: 10 mg via INTRAVENOUS

## 2021-08-27 MED ORDER — BUPIVACAINE HCL (PF) 0.5 % IJ SOLN
INTRAMUSCULAR | Status: DC | PRN
  Administered 2021-08-27: 5 mL

## 2021-08-27 MED ORDER — CEFAZOLIN SODIUM-DEXTROSE 2-4 GM/100ML-% IV SOLN
2.0000 g | Freq: Three times a day (TID) | INTRAVENOUS | Status: AC
  Administered 2021-08-27 (×2): 2 g via INTRAVENOUS
  Filled 2021-08-27 (×2): qty 100

## 2021-08-27 SURGICAL SUPPLY — 62 items
BAG COUNTER SPONGE SURGICOUNT (BAG) ×3 IMPLANT
BASKET BONE COLLECTION (BASKET) ×2 IMPLANT
BENZOIN TINCTURE PRP APPL 2/3 (GAUZE/BANDAGES/DRESSINGS) ×2 IMPLANT
BLADE CLIPPER SURG (BLADE) IMPLANT
BUR CARBIDE MATCH 3.0 (BURR) ×2 IMPLANT
CANISTER SUCT 3000ML PPV (MISCELLANEOUS) ×2 IMPLANT
CNTNR URN SCR LID CUP LEK RST (MISCELLANEOUS) ×1 IMPLANT
CONT SPEC 4OZ STRL OR WHT (MISCELLANEOUS) ×1
COVER BACK TABLE 60X90IN (DRAPES) ×2 IMPLANT
DERMABOND ADHESIVE PROPEN (GAUZE/BANDAGES/DRESSINGS) ×1
DERMABOND ADVANCED (GAUZE/BANDAGES/DRESSINGS) ×1
DERMABOND ADVANCED .7 DNX12 (GAUZE/BANDAGES/DRESSINGS) ×1 IMPLANT
DERMABOND ADVANCED .7 DNX6 (GAUZE/BANDAGES/DRESSINGS) IMPLANT
DRAPE C-ARM 42X72 X-RAY (DRAPES) ×6 IMPLANT
DRAPE LAPAROTOMY 100X72X124 (DRAPES) ×2 IMPLANT
DRAPE SURG 17X23 STRL (DRAPES) ×2 IMPLANT
DRSG OPSITE POSTOP 4X6 (GAUZE/BANDAGES/DRESSINGS) ×1 IMPLANT
DURAPREP 26ML APPLICATOR (WOUND CARE) ×2 IMPLANT
ELECT REM PT RETURN 9FT ADLT (ELECTROSURGICAL) ×2
ELECTRODE REM PT RTRN 9FT ADLT (ELECTROSURGICAL) ×1 IMPLANT
EVACUATOR 1/8 PVC DRAIN (DRAIN) ×2 IMPLANT
GAUZE 4X4 16PLY ~~LOC~~+RFID DBL (SPONGE) IMPLANT
GLOVE BIO SURGEON STRL SZ7 (GLOVE) IMPLANT
GLOVE BIO SURGEON STRL SZ8 (GLOVE) ×4 IMPLANT
GLOVE BIOGEL PI IND STRL 7.0 (GLOVE) IMPLANT
GLOVE BIOGEL PI INDICATOR 7.0 (GLOVE)
GOWN STRL REUS W/ TWL LRG LVL3 (GOWN DISPOSABLE) IMPLANT
GOWN STRL REUS W/ TWL XL LVL3 (GOWN DISPOSABLE) ×2 IMPLANT
GOWN STRL REUS W/TWL 2XL LVL3 (GOWN DISPOSABLE) IMPLANT
GOWN STRL REUS W/TWL LRG LVL3 (GOWN DISPOSABLE)
GOWN STRL REUS W/TWL XL LVL3 (GOWN DISPOSABLE) ×2
GRAFT BONE PROTEIOS LRG 5CC (Orthopedic Implant) ×1 IMPLANT
HEMOSTAT POWDER KIT SURGIFOAM (HEMOSTASIS) ×2 IMPLANT
KIT BASIN OR (CUSTOM PROCEDURE TRAY) ×2 IMPLANT
KIT GRAFTMAG DEL NEURO DISP (NEUROSURGERY SUPPLIES) IMPLANT
KIT TURNOVER KIT B (KITS) ×2 IMPLANT
MATRIX SPINE STRIP NEOCORE 5CC (Putty) IMPLANT
MILL MEDIUM DISP (BLADE) ×2 IMPLANT
NDL HYPO 25X1 1.5 SAFETY (NEEDLE) ×1 IMPLANT
NEEDLE HYPO 25X1 1.5 SAFETY (NEEDLE) ×2 IMPLANT
NS IRRIG 1000ML POUR BTL (IV SOLUTION) ×2 IMPLANT
PACK LAMINECTOMY NEURO (CUSTOM PROCEDURE TRAY) ×2 IMPLANT
PAD ARMBOARD 7.5X6 YLW CONV (MISCELLANEOUS) ×6 IMPLANT
ROD LORD LIPPED TI 5.5X35 (Rod) ×2 IMPLANT
SCREW ILIAC PA 5.5X40 (Screw) ×2 IMPLANT
SCREW POLYAXIAL TULIP (Screw) ×2 IMPLANT
SCREW SHANK MOD 5.5X40 (Screw) ×2 IMPLANT
SET SCREW (Screw) ×4 IMPLANT
SET SCREW SPNE (Screw) IMPLANT
SPACER IDENTITI PS 9X9X25 10D (Spacer) ×2 IMPLANT
SPONGE SURGIFOAM ABS GEL 100 (HEMOSTASIS) IMPLANT
SPONGE T-LAP 4X18 ~~LOC~~+RFID (SPONGE) IMPLANT
STRIP CLOSURE SKIN 1/2X4 (GAUZE/BANDAGES/DRESSINGS) ×3 IMPLANT
STRIP MATRIX NEOCORE 5CC (Putty) ×1 IMPLANT
SUT VIC AB 0 CT1 18XCR BRD8 (SUTURE) ×1 IMPLANT
SUT VIC AB 0 CT1 8-18 (SUTURE) ×1
SUT VIC AB 2-0 CP2 18 (SUTURE) ×2 IMPLANT
SUT VIC AB 3-0 SH 8-18 (SUTURE) ×4 IMPLANT
TOWEL GREEN STERILE (TOWEL DISPOSABLE) ×2 IMPLANT
TOWEL GREEN STERILE FF (TOWEL DISPOSABLE) ×2 IMPLANT
TRAY FOLEY MTR SLVR 16FR STAT (SET/KITS/TRAYS/PACK) ×2 IMPLANT
WATER STERILE IRR 1000ML POUR (IV SOLUTION) ×2 IMPLANT

## 2021-08-27 NOTE — Op Note (Signed)
08/27/2021 ? ?12:05 PM ? ?PATIENT:  Bonnie Robinson  71 y.o. female ? 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 ?POST-OPERATIVE DIAGNOSIS:  same ? ?PROCEDURE:   ?1. Decompressive lumbar laminectomy, hemi facetectomy and foraminotomies L4-5 requiring more work than would be required for a simple exposure of the disk for PLIF in order to adequately decompress the neural elements and address the spinal stenosis and remove a hemorrhagic synovial cyst at L4-5 on the left ?2. Posterior lumbar interbody fusion L4-5 using PTI interbody cages packed with morcellized allograft and autograft  ?3. Posterior fixation L4-5 using atec cortical pedicle screws.  ?4. Intertransverse arthrodesis L4-5 using morcellized autograft and allograft. ? ?SURGEON:  Sherley Bounds, MD ? ?ASSISTANTS: Glenford Peers FNP ? ?ANESTHESIA:  General ? ?EBL: 175 ml ? ?Total I/O ?In: 1110 [P.O.:60; I.V.:1000; IV Piggyback:50] ?Out: 1075 [Urine:900; Blood:175] ? ?BLOOD ADMINISTERED:none ? ?DRAINS: none  ? ?INDICATION FOR PROCEDURE: This patient presented with back pain with bilateral leg pain and left EHL weakness with gait disturbance. Imaging revealed response stenosis L4-5 with spondylolisthesis. The patient tried a reasonable attempt at conservative medical measures without relief. I recommended decompression and instrumented fusion to address the stenosis as well as the segmental  instability.  Patient understood the risks, benefits, and alternatives and potential outcomes and wished to proceed. ? ?PROCEDURE DETAILS:  ?The patient was brought to the operating room. After induction of generalized endotracheal anesthesia the patient was rolled into the prone position on chest rolls and all pressure points were padded. The patient's lumbar region was cleaned and then prepped with DuraPrep and draped in the usual sterile fashion. Anesthesia was injected and then a dorsal  midline incision was made and carried down to the lumbosacral fascia. The fascia was opened and the paraspinous musculature was taken down in a subperiosteal fashion to expose L4-5. A self-retaining retractor was placed. Intraoperative fluoroscopy confirmed my level, and I started with placement of the L4 cortical pedicle screws. The pedicle screw entry zones were identified utilizing surface landmarks and  AP and lateral fluoroscopy. I scored the cortex with the high-speed drill and then used the hand drill to drill an upward and outward direction into the pedicle. I then tapped line to line. I then placed a 5.5 x 40 mm cortical pedicle screw into the pedicles of L4 bilaterally.  ? ? I then turned my attention to the decompression and complete lumbar laminectomies, hemi- facetectomies, and foraminotomies were performed at L4-5.  My nurse practitioner was directly involved in the decompression and exposure of the neural elements. the patient had significant spinal stenosis and this required more work than would be required for a simple exposure of the disc for posterior lumbar interbody fusion which would only require a limited laminotomy. Much more generous decompression and generous foraminotomy was undertaken in order to adequately decompress the neural elements and address the patient's leg pain. The yellow ligament was removed to expose the underlying dura and nerve roots, and generous foraminotomies were performed to adequately decompress the neural elements. Both the exiting and traversing nerve roots were decompressed on both sides until a coronary dilator passed easily along the nerve roots.  There was a large hemorrhagic synovial cyst at L4-5 on the left causing severe dural compression.  This was teased away from the dura and removed with a micropituitary rongeur and Kerrison punch . once the decompression was complete, I turned my attention to the posterior lumbar interbody  fusion. The epidural venous  vasculature was coagulated and cut sharply. Disc space was incised and the initial discectomy was performed with pituitary rongeurs. The disc space was distracted with sequential distractors to a height of 10 mm. We then used a series of scrapers and shavers to prepare the endplates for fusion. The midline was prepared with Epstein curettes. Once the complete discectomy was finished, we packed an appropriate sized interbody cage with local autograft and morcellized allograft, gently retracted the nerve root, and tapped the cage into position at L4-5.  The midline between the cages was packed with morselized autograft and allograft.  ? ?We then turned our attention to the placement of the lower pedicle screws. The pedicle screw entry zones were identified utilizing surface landmarks and fluoroscopy. I drilled into each pedicle utilizing the hand drill, and tapped each pedicle with the appropriate tap. We palpated with a ball probe to assure no break in the cortex. We then placed 5.5 x 40 mm pedicle screws into the pedicles bilaterally at L5.  My nurse practitioner assisted in placement of the pedicle screws.  We then decorticated the transverse processes and laid a mixture of morcellized autograft and allograft out over these to perform intertransverse arthrodesis at L4-5. We then placed lordotic rods into the multiaxial screw heads of the pedicle screws and locked these in position with the locking caps and anti-torque device. We then checked our construct with AP and lateral fluoroscopy. Irrigated with copious amounts of bacitracin-containing saline solution. Inspected the nerve roots once again to assure adequate decompression, lined to the dura with Gelfoam,  and then we closed the muscle and the fascia with 0 Vicryl. Closed the subcutaneous tissues with 2-0 Vicryl and subcuticular tissues with 3-0 Vicryl. The skin was closed with benzoin and Steri-Strips. Dressing was then applied, the patient was awakened from  general anesthesia and transported to the recovery room in stable condition. At the end of the procedure all sponge, needle and instrument counts were correct.  ? ?PLAN OF CARE: admit to inpatient ? ?PATIENT DISPOSITION:  PACU - hemodynamically stable. ?  ?Delay start of Pharmacological VTE agent (>24hrs) due to surgical blood loss or risk of bleeding:  yes ? ? ?

## 2021-08-27 NOTE — Anesthesia Procedure Notes (Signed)
Procedure Name: Intubation ?Date/Time: 08/27/2021 9:36 AM ?Performed by: Alain Marion, CRNA ?Pre-anesthesia Checklist: Patient identified, Emergency Drugs available, Suction available and Patient being monitored ?Patient Re-evaluated:Patient Re-evaluated prior to induction ?Oxygen Delivery Method: Circle System Utilized ?Preoxygenation: Pre-oxygenation with 100% oxygen ?Induction Type: IV induction ?Ventilation: Mask ventilation without difficulty ?Laryngoscope Size: Glidescope and 3 ?Grade View: Grade I ?Tube type: Oral ?Number of attempts: 1 ?Airway Equipment and Method: Stylet and Video-laryngoscopy ?Placement Confirmation: ETT inserted through vocal cords under direct vision, positive ETCO2 and breath sounds checked- equal and bilateral ?Secured at: 21 cm ?Tube secured with: Tape ?Dental Injury: Teeth and Oropharynx as per pre-operative assessment  ?Comments: Elective glide use due to cervical spine disease.  ? ? ? ? ?

## 2021-08-27 NOTE — Transfer of Care (Signed)
Immediate Anesthesia Transfer of Care Note ? ?Patient: Bonnie Robinson ? ?Procedure(s) Performed: Posterior Lumbar Interbody Fusion Lumbar Four-Five (Back) ? ?Patient Location: PACU ? ?Anesthesia Type:General ? ?Level of Consciousness: awake, alert  and oriented ? ?Airway & Oxygen Therapy: Patient Spontanous Breathing ? ?Post-op Assessment: Report given to RN and Post -op Vital signs reviewed and stable ? ?Post vital signs: Reviewed and stable ? ?Last Vitals:  ?Vitals Value Taken Time  ?BP 121/68   ?Temp    ?Pulse 73   ?Resp 14   ?SpO2 97%   ? ? ?Last Pain:  ?Vitals:  ? 08/27/21 0803  ?TempSrc: Oral  ?PainSc:   ?   ? ?Patients Stated Pain Goal: 0 (08/27/21 0727) ? ?Complications: No notable events documented. ?

## 2021-08-27 NOTE — Anesthesia Postprocedure Evaluation (Signed)
Anesthesia Post Note ? ?Patient: Bonnie Robinson ? ?Procedure(s) Performed: Posterior Lumbar Interbody Fusion Lumbar Four-Five (Back) ? ?  ? ?Patient location during evaluation: PACU ?Anesthesia Type: General ?Level of consciousness: awake and alert ?Pain management: pain level controlled ?Vital Signs Assessment: post-procedure vital signs reviewed and stable ?Respiratory status: spontaneous breathing, nonlabored ventilation and respiratory function stable ?Cardiovascular status: blood pressure returned to baseline and stable ?Postop Assessment: no apparent nausea or vomiting ?Anesthetic complications: no ? ? ?No notable events documented. ? ?Last Vitals:  ?Vitals:  ? 08/27/21 1422 08/27/21 1423  ?BP:  124/76  ?Pulse:    ?Resp: 17 14  ?Temp:  36.4 ?C  ?SpO2:  95%  ?  ?Last Pain:  ?Vitals:  ? 08/27/21 1423  ?TempSrc: Oral  ?PainSc:   ? ? ?  ?  ?  ?  ?  ?  ? ?Audry Pili ? ? ? ? ?

## 2021-08-27 NOTE — Progress Notes (Signed)
I had a long discussion with her this morning regarding her cervical spine disease.  I have discussed this with at least 2 of my partners.  We have gone over the MRI.  The MRI shows a fusion at C4-5 with up to moderate stenosis at C4-5 but no frank cord compression or signal change within the cord and a thin rim of CSF posterior to the cord. ? ?I reexamined her and there is no evidence of myelopathy on exam.  She has a negative Hoffmann sign, negative Babinski, no clonus, no weakness or numbness in the hands, and I do not believe her gait is spastic.  It is antalgic but not spastic.  She has good dorsi flexion but has EHL weakness on the left. ? ?The question is, with her moderate stenosis at C4-5 from posterior element hypertrophy, if she at risk of neurologic compromise with intubation and positioning prone for lumbar spine surgery?  Given that the stenosis is read as moderate and not severe, her lack of neurologic signs on exam, her lack of neurologic symptoms in the upper extremities, we think the risk is quite small.  It is difficult to quantify.  Ever, we do know that there is risk with direct surgical decompression at C4-5 with lateral mass fixation and fusion, and the literature would suggest that this risk can be 7 to 10%.  I suspect that we intubate in position patient's for lumbar spine surgery who have neck's like this quite often, and do not even know it because there is no MRI of the cervical spine.  I also suspect that the risk of neurologic injury with positioning is low.  I have tried to explain all of this to her and she has demonstrated understanding and she is comfortable moving forward and states that "I am informed."  I explained to her that if she were my family I would make the same decision for my own family. ? ?We will use a glide scope to intubate her though I am not sure that is absolutely necessary, and I will be there for the positioning and try to make sure as best I can that the head  is neutral and not extended. ?

## 2021-08-27 NOTE — Progress Notes (Signed)
Physical Therapy Evaluation ?Patient Details ?Name: Bonnie Robinson ?MRN: [id number] ?DOB: Nov 20, 1950 ?Today's Date: 08/27/2021 ? ?History of Present Illness ? Patient is a 71 y.o. female admitted 4/10 who complains of back pain and gait disturbance. Onset of symptoms was 1 week ago, stable since that time. The patient saw her primary care physician has tried gabapentin and steroids and muscle relaxant. MRI showed severe spinal stenosis at L4-5 with a grade 1 spondylolisthesis.  4/14  pt s/p decompression at L45 with PLIF, posterior fixation and interteransverse arthrodesis at L45 with grafting.   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  She had an MRI and a CT scan of the cervical spine 6 months ago  ?Clinical Impression ? S/P L4-5 decompress/fusion, pt is sore, but moving better.  She is happy with the return of her foot movment.  Emphasis on education  for lumbar fusion and progression of mobility post d/c, work on transiton to EOB, sit to stands and gait training with a RW   ?   ? ?Recommendations for follow up therapy are one component of a multi-disciplinary discharge planning process, led by the attending physician.  Recommendations may be updated based on patient status, additional functional criteria and insurance authorization. ? ?Follow Up Recommendations No PT follow up (expect for all needs met prior to d/c) ? ?  ?Assistance Recommended at Discharge PRN  ?Patient can return home with the following ? A little help with bathing/dressing/bathroom;Assist for transportation;Assistance with cooking/housework ? ?  ?Equipment Recommendations    ?Recommendations for Other Services ?    ?  ?Functional Status Assessment Patient has had a recent decline in their functional status and demonstrates the ability to make significant improvements in function in a reasonable and predictable amount of time.  ? ?   ?Precautions / Restrictions Precautions ?Precautions: Back ?Precaution Comments: back precautions /education  ? ?  ? ?Mobility ? Bed Mobility ?Overal bed mobility: Needs Assistance ?Bed Mobility: Rolling, Sidelying to Sit, Sit to Sidelying ?Rolling: Min assist ?Sidelying to sit: Min assist ?  ?  ?Sit to sidelying: Min assist ?General bed mobility comments: educated on log roll and importance of transitioning to/from sidelying and EOB. ?  ? ?Transfers ?Overall transfer level: Needs assistance ?Equipment used: Rolling walker (2 wheels) ?Transfers: Sit to/from Stand ?Sit to Stand: Min guard ?  ?  ?  ?  ?  ?General transfer comment: cues for hand placement for safety ?  ? ?Ambulation/Gait ?Ambulation/Gait assistance: Min guard ?Gait Distance (Feet): 200 Feet ?Assistive device: Rolling walker (2 wheels) ?Gait Pattern/deviations: Step-through pattern ?  ?Gait velocity interpretation: 1.31 - 2.62 ft/sec, indicative of limited community ambulator ?  ?General Gait Details: generally steady gait with decent heel/toe pattern, light use of the RW ? ?Stairs ?  ?  ?  ?  ?  ? ?Wheelchair Mobility ?  ? ?Modified Rankin (Stroke Patients Only) ?  ? ?  ? ?Balance Overall balance assessment: Needs assistance ?Sitting-balance support: Feet supported ?Sitting balance-Leahy Scale: Good ?Sitting balance - Comments: doff brace at EOB, assisted donning brace. ?  ?  ?Standing balance-Leahy Scale: Fair ?  ?  ?  ?  ?  ?  ?  ?  ?  ?  ?  ?  ?   ? ? ? ?Pertinent Vitals/Pain Pain Assessment ?Pain Assessment: 0-10 ?Pain Score: 6  ?Pain Location: back around incision ?Pain Descriptors /  Indicators: Discomfort, Guarding ?Pain Intervention(s): Monitored during session  ? ? ?Home Living   ?Living Arrangements: Spouse/significant other ?  ?  ?  ?  ?  ?  ?  ?  ?   ?  ?Prior Function   ?  ?  ?  ?  ?  ?  ?  ?  ?  ? ? ?Hand Dominance  ?   ? ?  ?Extremity/Trunk Assessment  ? Upper Extremity Assessment ?Upper Extremity Assessment: Overall WFL for tasks  assessed ?  ? ?Lower Extremity Assessment ?Lower Extremity Assessment: Overall WFL for tasks assessed (bil pf/df intact) ?  ? ?Cervical / Trunk Assessment ?Cervical / Trunk Assessment: Back Surgery  ?Communication  ?    ?Cognition Arousal/Alertness: Awake/alert ?Behavior During Therapy: [facility] for tasks assessed/performed ?Overall Cognitive Status: Within Functional Limits for tasks assessed ?  ?  ?  ?  ?  ?  ?  ?  ?  ?  ?  ?  ?  ?  ?  ?  ?  ?  ?  ? ?  ?General Comments General comments (skin integrity, edema, etc.): reinforced all back care/prec, log roll, transition via sidelying, braceing issues, lifting restrictions and progression of activity post d/c. ? ?  ?Exercises    ? ?Assessment/Plan  ?  ?PT Assessment Patient needs continued PT services  ?PT Problem List Decreased activity tolerance;Decreased mobility;Decreased knowledge of precautions;Pain ? ?   ?  ?PT Treatment Interventions DME instruction;Gait training;Stair training;Functional mobility training;Therapeutic activities;Patient/family education   ? ?PT Goals (Current goals can be found in the Care Plan section)  ?Acute Rehab PT Goals ?Patient Stated Goal: to walk better ?PT Goal Formulation: With patient/family ?Time For Goal Achievement: 09/07/21 ?Potential to Achieve Goals: Good ? ?  ?Frequency Min 4X/week ?  ? ? ?Co-evaluation   ?  ?  ?  ?  ? ? ?  ?AM-PAC PT "6 Clicks" Mobility  ?Outcome Measure Help needed turning from your back to your side while in a flat bed without using bedrails?: A Little ?Help needed moving from lying on your back to sitting on the side of a flat bed without using bedrails?: A Little ?Help needed moving to and from a bed to a chair (including a wheelchair)?: A Little ?Help needed standing up from a chair using your arms (e.g., wheelchair or bedside chair)?: A Little ?Help needed to walk in hospital room?: A Little ?Help needed climbing 3-5 steps with a railing? : A Little ?6 Click Score: 18 ? ?  ?End of Session   ?Activity  Tolerance: Patient tolerated treatment well ?Patient left: in bed;with call bell/phone within reach;with chair alarm set;with family/visitor present ?Nurse Communication: Mobility status ?PT Visit Diagnosis: Other abnormalities of gait and mobility (R26.89);Pain ?Pain - part of body:  (back) ?  ? ?Time: 3202-3343 ?PT Time Calculation (min) (ACUTE ONLY): 46 min ? ? ?Charges:   PT Evaluation ?$PT Re-evaluation: 1 Re-eval ?PT Treatments ?$Gait Training: 8-22 mins ?$Self Care/Home Management: 8-22 ?  ?   ? ? ?08/27/2021 ? ?Ginger Carne., PT ?Acute Rehabilitation Services ?[phone]  (pager) ?[phone]  (office) ? ?Bonnie Robinson ?08/27/2021, 6:19 PM ?

## 2021-08-27 NOTE — Progress Notes (Signed)
Orthopedic Tech Progress Note ?Patient Details:  ?Bonnie Robinson ?February 04, 1951 ?[id number] ? ?Ortho Devices ?Type of Ortho Device: Lumbar corsett ?Ortho Device/Splint Interventions: Ordered ?  ?  ? ?[name] ?08/27/2021, 3:19 PM ? ?

## 2021-08-28 ENCOUNTER — Inpatient Hospital Stay (HOSPITAL_COMMUNITY): Payer: PPO

## 2021-08-28 IMAGING — MR MR THORACIC SPINE W/O CM
4 of 9 series · 18 of 48 positions shown · non-contrast
Comparison: No prior thoracic imaging. Recent cervical spine MRI
[DATE]. Intraoperative lumbar fluoroscopy yesterday.

CLINICAL DATA: 70-year-old female with recent lumbar fusion.
Ataxia.

EXAM:
MRI THORACIC SPINE WITHOUT CONTRAST
TECHNIQUE: Multiplanar, multisequence MR imaging of the thoracic spine was
performed. No intravenous contrast was administered.

[Series 2: T1 · sagittal · 3.0mm · 0.90mm/px · 3 of 12 slices shown]
[im 1/12]
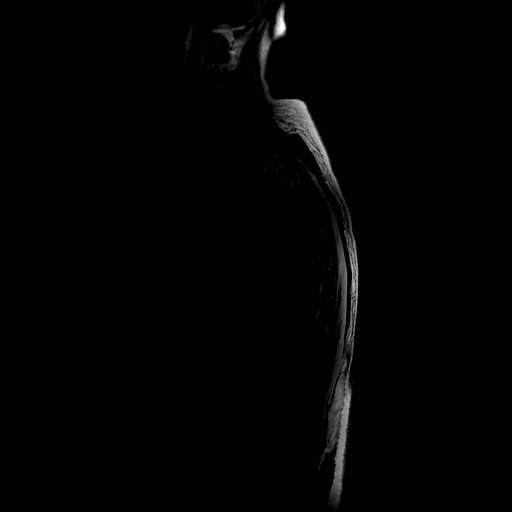
[im 6/12]
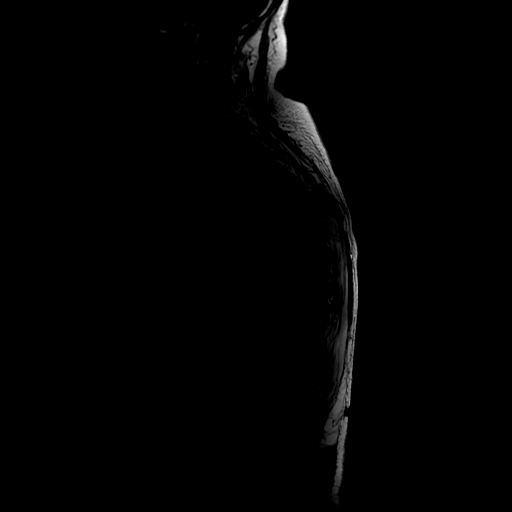
[im 12/12]
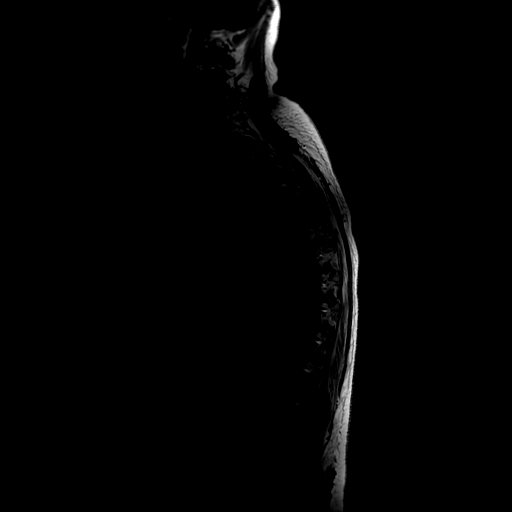

[Series 3: T2 · sagittal · 3.0mm · 0.66mm/px · 4 of 19 slices shown (1 of 3)]
[im 1/19]
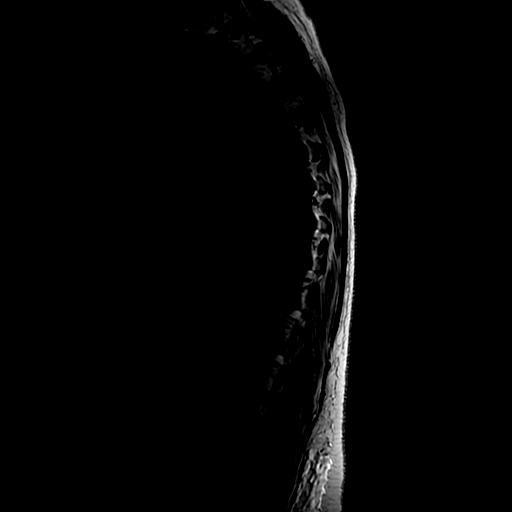
[im 7/19]
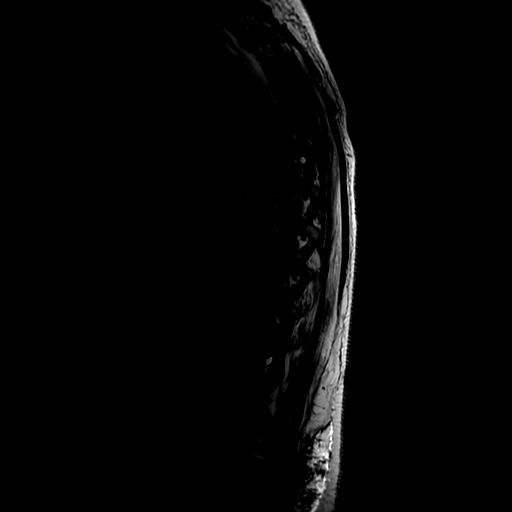
[im 13/19]
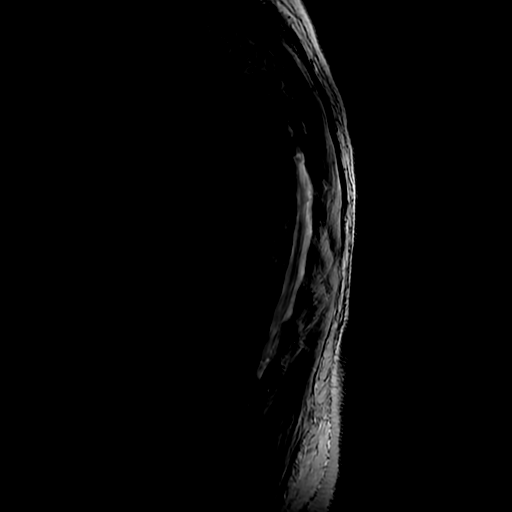
[im 19/19]
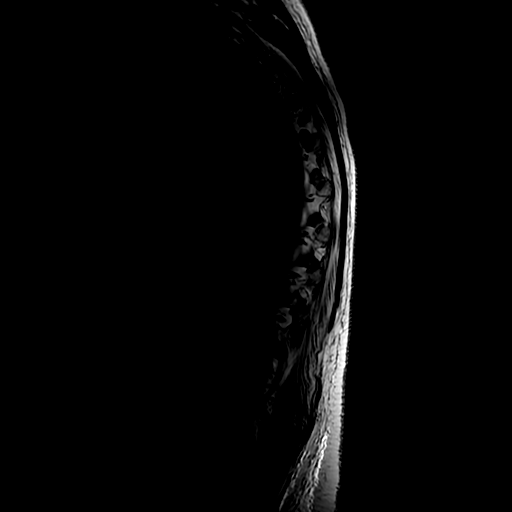

[Series 6: T2 · axial · 4.0mm · 0.39mm/px · z∈[-154,-28]mm · 6 of 26 slices shown (2 of 3)]
[im 1/26]
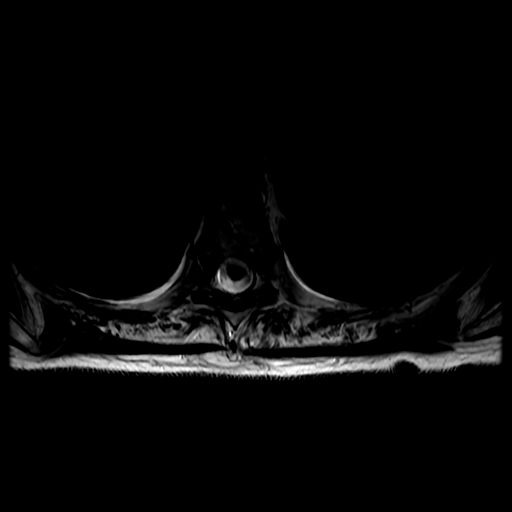
[im 6/26]
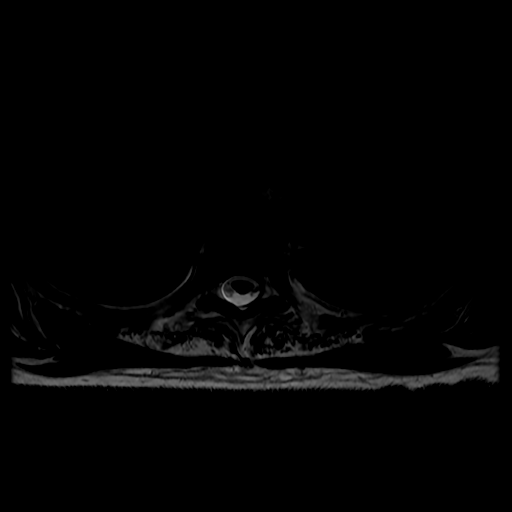
[im 11/26]
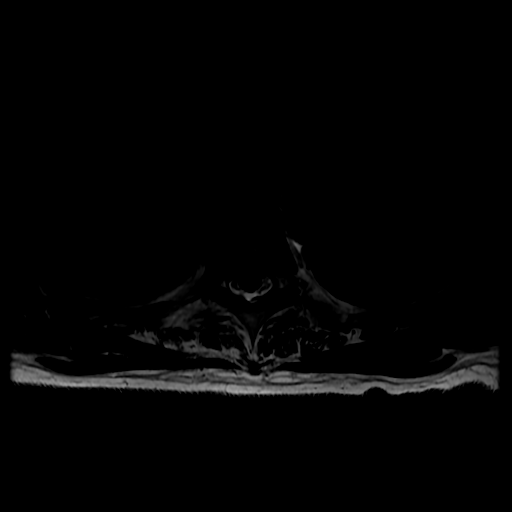
[im 16/26]
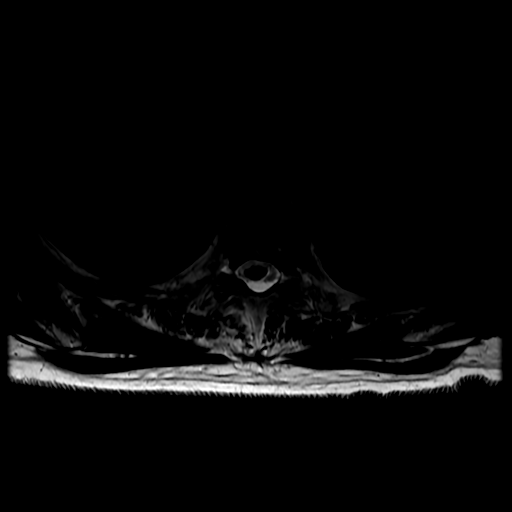
[im 21/26]
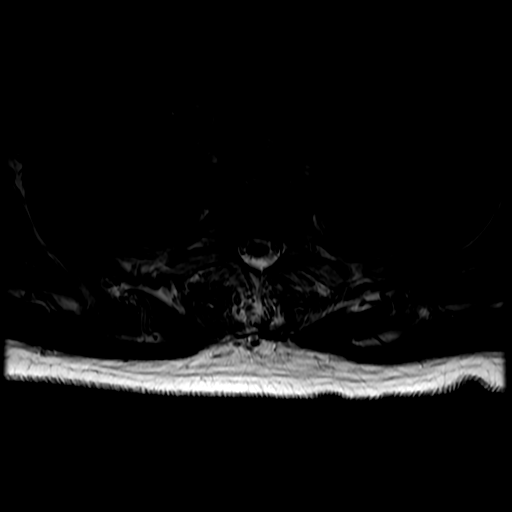
[im 26/26]
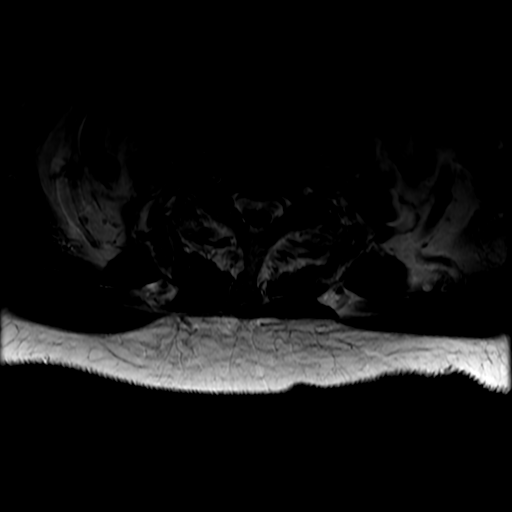

[Series 8: T2 · axial · 4.0mm · 0.39mm/px · z∈[-249,-115]mm · 5 of 32 slices shown (3 of 3)]
[im 1/32]
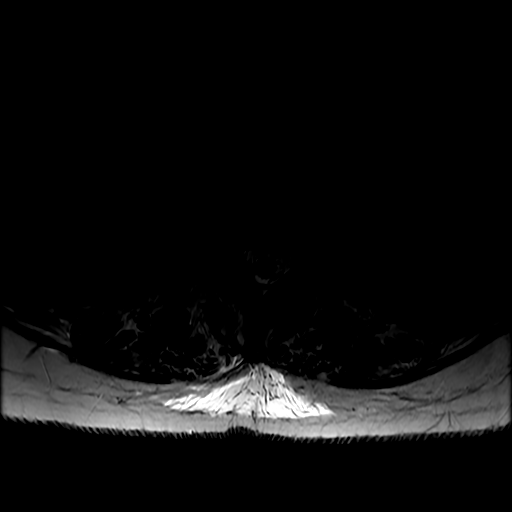
[im 6/32]
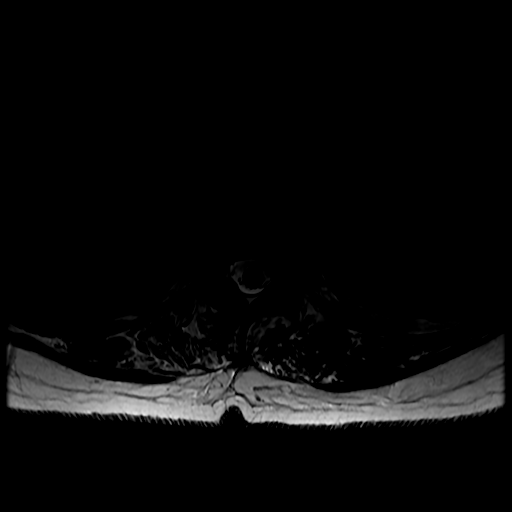
[im 11/32]
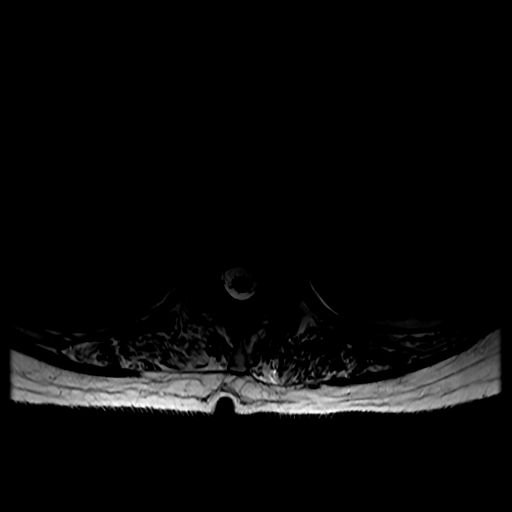
[im 16/32]
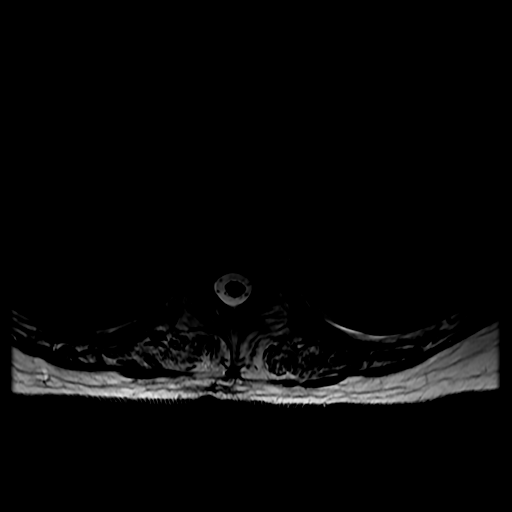
[im 26/32]
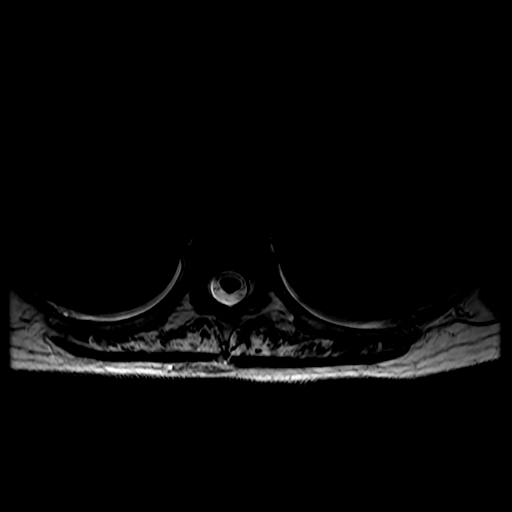

[18 of 48 positions shown; findings below may reference images not displayed]

FINDINGS: Limited cervical spine imaging: Stable from recent [DATE] MRI
(please see that report)

Thoracic spine segmentation:  Appears to be normal.

Alignment: Mildly exaggerated thoracic kyphosis. Subtle levoconvex
thoracic scoliosis. There is mild degenerative appearing
anterolisthesis T2-T3 through T4-T5.

Vertebrae: No marrow edema or evidence of acute osseous abnormality.
Background bone marrow signal within normal limits.

Cord: No thoracic spinal cord signal abnormality, and capacious
thoracic spinal canal at most levels (see exceptions below). Conus
medullaris appears to be normal at T12-L1.

Paraspinal and other soft tissues: Negative visible thoracic
viscera. Stable visible abdominal viscera from the recent [DATE]
lumbar exam. Negative thoracic paraspinal soft tissues.

Disc levels:

T2-T3: Mild anterolisthesis with disc space loss and mild to
moderate posterior element hypertrophy. But no significant spinal or
foraminal stenosis.

T3-T4: Disc space loss with subtle anterolisthesis. Disc bulging
with mild to moderate facet and ligament flavum hypertrophy greater
on the right. No significant spinal stenosis. Mild right T3
foraminal stenosis.

T4-T5: Disc space loss with mild anterolisthesis. Disc bulging and
mild to moderate facet and ligament flavum hypertrophy. Borderline
to mild spinal stenosis here (series 3, image 11). No foraminal
stenosis.

Capacious thoracic spinal canal below that level.

Moderate to severe facet hypertrophy at T10-T11 greater on the left
results in moderate to severe left and mild to moderate right T10
foraminal stenosis.

There are occasional bilateral thoracic nerve root diverticula, such
as at the right greater than left T11 nerve levels on series 8,
images 24 and 25.
IMPRESSION: 1. Exaggerated thoracic kyphosis with mild multilevel upper thoracic
spondylolisthesis T2-T3 through T4-T5. Associated disc and posterior
element degeneration at those levels with borderline to mild spinal
stenosis at the latter.

2. Capacious thoracic spinal canal elsewhere. Normal thoracic spinal
cord.

3. Lower thoracic facet hypertrophy results in moderate to severe
left greater than right T10 neural foraminal stenosis.

## 2021-08-28 MED ORDER — DEXAMETHASONE SODIUM PHOSPHATE 4 MG/ML IJ SOLN: 2.0000 mg | Freq: Three times a day (TID) | INTRAMUSCULAR | Status: DC

## 2021-08-28 MED ORDER — PRAMIPEXOLE DIHYDROCHLORIDE 1.5 MG PO TABS
4.5000 mg | ORAL_TABLET | Freq: Every day | ORAL | Status: DC
Start: 2021-08-28 — End: 2021-08-29
  Administered 2021-08-28: 4.5 mg via ORAL
  Filled 2021-08-28 (×2): qty 3

## 2021-08-28 MED ORDER — DEXAMETHASONE 2 MG PO TABS
2.0000 mg | ORAL_TABLET | Freq: Three times a day (TID) | ORAL | Status: DC
Start: 2021-08-28 — End: 2021-08-29
  Administered 2021-08-28 – 2021-08-29 (×2): 2 mg via ORAL
  Filled 2021-08-28 (×2): qty 1

## 2021-08-28 MED ORDER — PRAMIPEXOLE DIHYDROCHLORIDE 1.5 MG PO TABS
4.5000 mg | ORAL_TABLET | Freq: Every day | ORAL | Status: DC
  Filled 2021-08-28: qty 3

## 2021-08-28 MED ORDER — PANTOPRAZOLE SODIUM 40 MG PO TBEC
40.0000 mg | DELAYED_RELEASE_TABLET | Freq: Every day | ORAL | Status: DC
  Administered 2021-08-28: 40 mg via ORAL
  Filled 2021-08-28: qty 1

## 2021-08-28 MED ORDER — DEXAMETHASONE 4 MG PO TABS
4.0000 mg | ORAL_TABLET | Freq: Three times a day (TID) | ORAL | Status: DC
  Administered 2021-08-28: 4 mg via ORAL
  Filled 2021-08-28: qty 1

## 2021-08-28 NOTE — Evaluation (Signed)
Occupational Therapy Re-Evaluation ?Patient Details ?Name: Bonnie Robinson ?MRN: [id number] ?DOB: 03-Jun-1950 ?Today's Date: 08/28/2021 ? ? ?History of Present Illness Patient is a 71 y.o. female admitted 4/10 who complains of back pain and gait disturbance. Onset of symptoms was 1 week ago, stable since that time. The patient saw her primary care physician has tried gabapentin and steroids and muscle relaxant. MRI showed severe spinal stenosis at L4-5 with a grade 1 spondylolisthesis.  4/14  pt s/p decompression at L45 with PLIF, posterior fixation and interteransverse arthrodesis at L45 with grafting.   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  She had an MRI and a CT scan of the cervical spine 6 months ago  ? ?Clinical Impression ?  ?Pt seen today s/p PLIF of L4-5. Pt presents with increased mobility, strength, activity tolerance, and balance. Pt recalled all spinal precautions and demonstrated safe compensatory strategies provided to her by OT. Overall pt is mod I and has no further OT needs at this time, acute OT will sign off.   ?   ? ?Recommendations for follow up therapy are one component of a multi-disciplinary discharge planning process, led by the attending physician.  Recommendations may be updated based on patient status, additional functional criteria and insurance authorization.  ? ?Follow Up Recommendations ? No OT follow up  ?  ?Assistance Recommended at Discharge Set up Supervision/Assistance  ?Patient can return home with the following A little help with walking and/or transfers;A little help with bathing/dressing/bathroom;Assistance with cooking/housework ? ?  ?Functional Status Assessment ? Patient has had a recent decline in their functional status and demonstrates the ability to make significant improvements in function in a reasonable and predictable amount of time.  ?Equipment  Recommendations ? None recommended by OT  ?  ?Recommendations for Other Services   ? ? ?  ?Precautions / Restrictions Precautions ?Precautions: Back ?Precaution Booklet Issued: Yes (comment) ?Precaution Comments: back precautions /education ?Restrictions ?Weight Bearing Restrictions: No  ? ?  ? ?Mobility Bed Mobility ?Overal bed mobility: Needs Assistance ?Bed Mobility: Rolling, Sidelying to Sit, Sit to Sidelying ?Rolling: Supervision ?Sidelying to sit: Supervision ?  ?  ?Sit to sidelying: Supervision ?General bed mobility comments: No assist needed today with bed mobility ?  ? ?Transfers ?Overall transfer level: Modified independent ?Equipment used: Rolling walker (2 wheels) ?Transfers: Sit to/from Stand ?Sit to Stand: Modified independent (Device/Increase time) ?  ?  ?  ?  ?  ?General transfer comment: No difficulties from bed or toilet, cues for hand placement ?  ? ?  ?Balance Overall balance assessment: Mild deficits observed, not formally tested ?  ?  ?  ?  ?  ?  ?  ?  ?  ?  ?  ?  ?  ?  ?  ?  ?  ?  ?   ? ?ADL either performed or assessed with clinical judgement  ? ?ADL Overall ADL's : Modified independent ?  ?  ?  ?  ?  ?  ?  ?  ?  ?  ?  ?  ?  ?  ?  ?  ?  ?  ?  ?General ADL Comments: Pt able to complete all ADL's with no difficulties, following spinal precautions and compensatory strategies  ? ? ? ?Vision Baseline Vision/History: 1 Wears glasses ?Ability to See in Adequate Light: 0 Adequate ?Patient Visual Report: No change from  baseline ?Vision Assessment?: No apparent visual deficits  ?   ?Perception   ?  ?Praxis   ?  ? ?Pertinent Vitals/Pain Pain Assessment ?Pain Assessment: No/denies pain  ? ? ? ?Hand Dominance Right ?  ?Extremity/Trunk Assessment Upper Extremity Assessment ?Upper Extremity Assessment: Overall WFL for tasks assessed ?  ?Lower Extremity Assessment ?Lower Extremity Assessment: Defer to PT evaluation ?  ?Cervical / Trunk Assessment ?Cervical / Trunk Assessment: Back Surgery ?  ?Communication  Communication ?Communication: No difficulties ?  ?Cognition Arousal/Alertness: Awake/alert ?Behavior During Therapy: [facility] for tasks assessed/performed ?Overall Cognitive Status: Within Functional Limits for tasks assessed ?  ?  ?  ?  ?  ?  ?  ?  ?  ?  ?  ?  ?  ?  ?  ?  ?  ?  ?  ?General Comments  Reviewed precautions and compensatory strategies, vss on RA, husband present and supportive ? ?  ?Exercises   ?  ?Shoulder Instructions    ? ? ?Home Living Family/patient expects to be discharged to:: Private residence ?Living Arrangements: Spouse/significant other ?Available Help at Discharge: Family;Available 24 hours/day ?Type of Home: House ?Home Access: Stairs to enter ?Entrance Stairs-Number of Steps: 2 ?Entrance Stairs-Rails: Right ?Home Layout: Able to live on main level with bedroom/bathroom;Full bath on main level;Two level ?  ?  ?Bathroom Shower/Tub: Walk-in shower ?  ?Bathroom Toilet: Handicapped height ?  ?  ?Home Equipment: Shower seat - built Medical sales representative (2 wheels);Hand held shower head;Standard Gilford Rile ?  ?  ?  ? ?  ?Prior Functioning/Environment Prior Level of Function : Driving;Independent/Modified Independent ?  ?  ?  ?  ?  ?  ?Mobility Comments: recently began using walker for safety due to pain ?ADLs Comments: I per pt ?  ? ?  ?  ?OT Problem List: Decreased strength;Decreased range of motion;Decreased activity tolerance;Impaired balance (sitting and/or standing);Pain ?  ?   ?OT Treatment/Interventions:    ?  ?OT Goals(Current goals can be found in the care plan section) Acute Rehab OT Goals ?Patient Stated Goal: To go home ?OT Goal Formulation: With patient ?Time For Goal Achievement: 08/28/21 ?Potential to Achieve Goals: Good  ?OT Frequency:   ?  ? ?Co-evaluation   ?  ?  ?  ?  ? ?  ?AM-PAC OT "6 Clicks" Daily Activity     ?Outcome Measure Help from another person eating meals?: None ?Help from another person taking care of personal grooming?: None ?Help from another person toileting, which includes  using toliet, bedpan, or urinal?: None ?Help from another person bathing (including washing, rinsing, drying)?: None ?Help from another person to put on and taking off regular upper body clothing?: None ?Help from another person to put on and taking off regular lower body clothing?: None ?6 Click Score: 24 ?  ?End of Session Equipment Utilized During Treatment: Rolling walker (2 wheels) ?Nurse Communication: Mobility status ? ?Activity Tolerance: Patient tolerated treatment well ?Patient left: in bed;with call bell/phone within reach;with family/visitor present ? ?OT Visit Diagnosis: Unsteadiness on feet (R26.81);Other abnormalities of gait and mobility (R26.89);Muscle weakness (generalized) (M62.81)  ?              ?Time: 2993-7169 ?OT Time Calculation (min): 19 min ?Charges:  OT General Charges ?$OT Visit: 1 Visit ?OT Evaluation ?$OT Eval Moderate Complexity: 1 Mod ? ?Eustacia Urbanek H., OTR/L ?Acute Rehabilitation ? ?Dymond Gutt Elane Yolanda Bonine ?08/28/2021, 1:00 PM ?

## 2021-08-28 NOTE — Plan of Care (Signed)
?  Problem: Education: ?Goal: Knowledge of General Education information will improve ?Description: Including pain rating scale, medication(s)/side effects and non-pharmacologic comfort measures ?Outcome: Progressing ?  ?Problem: Health Behavior/Discharge Planning: ?Goal: Ability to manage health-related needs will improve ?Outcome: Progressing ?  ?Problem: Clinical Measurements: ?Goal: Ability to maintain clinical measurements within normal limits will improve ?Outcome: Progressing ?Goal: Will remain free from infection ?Outcome: Progressing ?Goal: Diagnostic test results will improve ?Outcome: Progressing ?Goal: Respiratory complications will improve ?Outcome: Progressing ?  ?Problem: Activity: ?Goal: Risk for activity intolerance will decrease ?Outcome: Progressing ?  ?Problem: Coping: ?Goal: Level of anxiety will decrease ?Outcome: Progressing ?  ?Problem: Elimination: ?Goal: Will not experience complications related to bowel motility ?Outcome: Progressing ?Goal: Will not experience complications related to urinary retention ?Outcome: Progressing ?  ?Problem: Pain Managment: ?Goal: General experience of comfort will improve ?Outcome: Progressing ?  ?Problem: Safety: ?Goal: Ability to remain free from injury will improve ?Outcome: Progressing ?  ?Problem: Skin Integrity: ?Goal: Risk for impaired skin integrity will decrease ?Outcome: Progressing ?  ?

## 2021-08-28 NOTE — Progress Notes (Signed)
Subjective: ?Patient reports she's doing well and is pleased. Back is sore, no leg pain, walking somewhat better ? ?Objective: ?Vital signs in last 24 hours: ?Temp:  [97.6 ?F (36.4 ?C)-98 ?F (36.7 ?C)] 97.8 ?F (36.6 ?C) (04/15 6553) ?Pulse Rate:  [61-73] 61 (04/15 0723) ?Resp:  [11-18] 18 (04/15 0723) ?BP: (108-144)/(66-77) 144/71 (04/15 0723) ?SpO2:  [92 %-98 %] 97 % (04/15 0723) ? ?Intake/Output from previous day: ?04/14 0701 - 04/15 0700 ?In: 1449.8 [P.O.:60; I.V.:1239.5; IV Piggyback:150.3] ?Out: 1725 [Urine:1550; Blood:175] ?Intake/Output this shift: ?No intake/output data recorded. ? ?Neurologic: Grossly normal, walking is antalgic but more fluid ? ?Lab Results: ?Lab Results  ?Component Value Date  ? WBC 10.8 (H) 08/24/2021  ? HGB 13.3 08/24/2021  ? HCT 40.6 08/24/2021  ? MCV 89.8 08/24/2021  ? PLT 292 08/24/2021  ? ?No results found for: INR, PROTIME ?BMET ?Lab Results  ?Component Value Date  ? NA 142 08/25/2021  ? K 4.6 08/25/2021  ? CL 107 08/25/2021  ? CO2 28 08/25/2021  ? GLUCOSE 125 (H) 08/25/2021  ? BUN 25 (H) 08/25/2021  ? CREATININE 0.68 08/25/2021  ? CALCIUM 9.2 08/25/2021  ? ? ?Studies/Results: ?DG Lumbar Spine 2-3 Views ? ?Result Date: 08/27/2021 ?CLINICAL DATA:  L4-L5 lumbar interbody fusion, fluoroscopic assistance EXAM: LUMBAR SPINE - 2-3 VIEW; DG C-ARM 1-60 MIN-NO REPORT COMPARISON:  None. FLUOROSCOPY: Air kerma 43.15 mGy FINDINGS: Intraoperative fluoroscopic images of the lumbar spine demonstrate posterior discectomy and fusion of L4-L5. No obvious perihardware fracture or component malpositioning. IMPRESSION: Intraoperative fluoroscopic images of the lumbar spine demonstrate posterior discectomy and fusion of L4-L5. No obvious perihardware fracture or component malpositioning. Electronically Signed   By: Delanna Ahmadi M.D.   On: 08/27/2021 11:55  ? ?DG C-Arm 1-60 Min-No Report ? ?Result Date: 08/27/2021 ?CLINICAL DATA:  L4-L5 lumbar interbody fusion, fluoroscopic assistance EXAM: LUMBAR SPINE -  2-3 VIEW; DG C-ARM 1-60 MIN-NO REPORT COMPARISON:  None. FLUOROSCOPY: Air kerma 43.15 mGy FINDINGS: Intraoperative fluoroscopic images of the lumbar spine demonstrate posterior discectomy and fusion of L4-L5. No obvious perihardware fracture or component malpositioning. IMPRESSION: Intraoperative fluoroscopic images of the lumbar spine demonstrate posterior discectomy and fusion of L4-L5. No obvious perihardware fracture or component malpositioning. Electronically Signed   By: Delanna Ahmadi M.D.   On: 08/27/2021 11:55  ? ?DG C-Arm 1-60 Min-No Report ? ?Result Date: 08/27/2021 ?CLINICAL DATA:  L4-L5 lumbar interbody fusion, fluoroscopic assistance EXAM: LUMBAR SPINE - 2-3 VIEW; DG C-ARM 1-60 MIN-NO REPORT COMPARISON:  None. FLUOROSCOPY: Air kerma 43.15 mGy FINDINGS: Intraoperative fluoroscopic images of the lumbar spine demonstrate posterior discectomy and fusion of L4-L5. No obvious perihardware fracture or component malpositioning. IMPRESSION: Intraoperative fluoroscopic images of the lumbar spine demonstrate posterior discectomy and fusion of L4-L5. No obvious perihardware fracture or component malpositioning. Electronically Signed   By: Delanna Ahmadi M.D.   On: 08/27/2021 11:55   ? ?Assessment/Plan: ?Doing well, pt/ot, pain control, home soon ? ?Estimated body mass index is 30.18 kg/m? as calculated from the following: ?  Height as of this encounter: '5\' 2"'$  (1.575 m). ?  Weight as of this encounter: 74.8 kg. ? ? ? LOS: 5 days  ? ? ?Bonnie Robinson ?08/28/2021, 8:48 AM ? ? ? ? ?

## 2021-08-28 NOTE — Progress Notes (Signed)
Physical Therapy Treatment ?Patient Details ?Name: Bonnie Robinson ?MRN: [id number] ?DOB: Aug 08, 1950 ?Today's Date: 08/28/2021 ? ? ?History of Present Illness Patient is a 71 y.o. female admitted 4/10 who complains of back pain and gait disturbance. Onset of symptoms was 1 week ago, stable since that time. The patient saw her primary care physician has tried gabapentin and steroids and muscle relaxant. MRI showed severe spinal stenosis at L4-5 with a grade 1 spondylolisthesis.  4/14  pt s/p decompression at L45 with PLIF, posterior fixation and interteransverse arthrodesis at L45 with grafting.   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  She had an MRI and a CT scan of the cervical spine 6 months ago ? ?  ?PT Comments  ? ? Pt admitted with above diagnosis. Pt progressing with mobility with pt able to ambulate with RW with good safety. Follows precautions and was educated regarding use and appliecation of brace. Pt also educated on up and down steps.  Needs cues for back precautions with turns.   Pt currently with functional limitations due to balance and endurance deficits. Pt will benefit from skilled PT to increase their independence and safety with mobility to allow discharge to the venue listed below.      ?Recommendations for follow up therapy are one component of a multi-disciplinary discharge planning process, led by the attending physician.  Recommendations may be updated based on patient status, additional functional criteria and insurance authorization. ? ?Follow Up Recommendations ? No PT follow up (expect for all needs met prior to d/c) ?  ?  ?Assistance Recommended at Discharge PRN  ?Patient can return home with the following A little help with bathing/dressing/bathroom;Assist for transportation;Assistance with cooking/housework ?  ?Equipment Recommendations ? Rolling walker (2 wheels) (if does  not already have one)  ?  ?Recommendations for Other Services   ? ? ?  ?Precautions / Restrictions Precautions ?Precautions: Back ?Precaution Booklet Issued: Yes (comment) ?Precaution Comments: back precautions /education ?Required Braces or Orthoses: Spinal Brace ?Spinal Brace: Applied in sitting position ?Restrictions ?Weight Bearing Restrictions: No  ?  ? ?Mobility ? Bed Mobility ?Overal bed mobility: Needs Assistance ?Bed Mobility: Rolling, Sidelying to Sit, Sit to Sidelying ?Rolling: Supervision ?Sidelying to sit: Supervision ?  ?  ?Sit to sidelying: Supervision ?General bed mobility comments: No assist needed today with bed mobility ?  ? ?Transfers ?Overall transfer level: Modified independent ?Equipment used: Rolling walker (2 wheels) ?Transfers: Sit to/from Stand ?Sit to Stand: Modified independent (Device/Increase time) ?  ?  ?  ?  ?  ?General transfer comment: No difficulties from bed or toilet, cues for hand placement ?  ? ?Ambulation/Gait ?Ambulation/Gait assistance: Min guard ?Gait Distance (Feet): 450 Feet ?Assistive device: Rolling walker (2 wheels) ?Gait Pattern/deviations: Step-through pattern ?Gait velocity: reduced ?Gait velocity interpretation: 1.31 - 2.62 ft/sec, indicative of limited community ambulator ?  ?General Gait Details: generally steady gait with decent heel/toe pattern, light use of the RW ? ? ?Stairs ?  ?Stairs assistance: Min guard ?Stair Management: One rail Right, Step to pattern, Sideways ?Number of Stairs: 5 ?General stair comments: Ascending and descending sideways with right rail as this is what pt has at home, no LOB, min guard for safety. ? ? ?Wheelchair Mobility ?  ? ?Modified Rankin (Stroke Patients Only) ?  ? ? ?  ?Balance Overall balance assessment: Mild deficits observed, not formally tested ?Sitting-balance support: Feet supported ?Sitting  balance-Leahy Scale: Good ?  ?  ?Standing balance support: No upper extremity supported, Single extremity supported, Bilateral  upper extremity supported, During functional activity ?Standing balance-Leahy Scale: Fair ?Standing balance comment: Able to stand without UE support but benefits from UE support for standing mobility stability ?  ?  ?  ?  ?  ?  ?  ?  ?  ?  ?  ?  ? ?  ?Cognition Arousal/Alertness: Awake/alert ?Behavior During Therapy: [facility] for tasks assessed/performed ?Overall Cognitive Status: Within Functional Limits for tasks assessed ?  ?  ?  ?  ?  ?  ?  ?  ?  ?  ?  ?  ?  ?  ?  ?  ?  ?  ?  ? ?  ?Exercises   ? ?  ?General Comments General comments (skin integrity, edema, etc.): Worked on don/doff brace wtih pt ?  ?  ? ?Pertinent Vitals/Pain Pain Assessment ?Pain Assessment: Faces ?Faces Pain Scale: Hurts little more ?Pain Location: back around incision ?Pain Descriptors / Indicators: Discomfort, Guarding ?Pain Intervention(s): Limited activity within patient's tolerance, Monitored during session, Repositioned  ? ? ?Home Living   ?  ?  ?  ?  ?  ?  ?  ?  ?  ?   ?  ?Prior Function    ?  ?  ?   ? ?PT Goals (current goals can now be found in the care plan section) Acute Rehab PT Goals ?Patient Stated Goal: to walk better ?Progress towards PT goals: Progressing toward goals ? ?  ?Frequency ? ? ? Min 4X/week ? ? ? ?  ?PT Plan Current plan remains appropriate  ? ? ?Co-evaluation   ?  ?  ?  ?  ? ?  ?AM-PAC PT "6 Clicks" Mobility   ?Outcome Measure ? Help needed turning from your back to your side while in a flat bed without using bedrails?: None ?Help needed moving from lying on your back to sitting on the side of a flat bed without using bedrails?: None ?Help needed moving to and from a bed to a chair (including a wheelchair)?: None ?Help needed standing up from a chair using your arms (e.g., wheelchair or bedside chair)?: A Little ?Help needed to walk in hospital room?: A Little ?Help needed climbing 3-5 steps with a railing? : A Little ?6 Click Score: 21 ? ?  ?End of Session Equipment Utilized During Treatment: Gait belt ?Activity  Tolerance: Patient tolerated treatment well ?Patient left: with call bell/phone within reach;with chair alarm set;in chair ?Nurse Communication: Mobility status ?PT Visit Diagnosis: Other abnormalities of gait and mobility (R26.89);Pain ?Pain - part of body:  (back) ?  ? ? ?Time: 7564-3329 ?PT Time Calculation (min) (ACUTE ONLY): 24 min ? ?Charges:  $Gait Training: 23-37 mins          ?          ? ?Lyan Holck M,PT ?Acute Rehab Services ?[phone] ?[phone] (pager)  ? ? ?Sidney Silberman F Phillippa Straub ?08/28/2021, 4:23 PM ? ?

## 2021-08-29 MED ORDER — OXYCODONE HCL 5 MG PO TABS: 5.0000 mg | ORAL_TABLET | ORAL | 0 refills | Status: DC | PRN

## 2021-08-29 MED ORDER — CYCLOBENZAPRINE HCL 5 MG PO TABS: 5.0000 mg | ORAL_TABLET | Freq: Three times a day (TID) | ORAL | 0 refills | Status: DC | PRN

## 2021-08-29 NOTE — Discharge Summary (Signed)
Physician Discharge Summary  ?Patient ID: ?Bonnie Robinson ?MRN: [id number] ?DOB/AGE: 69-Dec-1952 69 y.o. ? ?Admit date: 08/23/2021 ?Discharge date: 08/29/2021 ? 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 ? ?Discharge Diagnoses: same ? ? ?Discharged Condition: good ? ?Hospital Course: The patient was admitted on 08/23/2021 for gait disturbance and back and leg pain. Mri showed spondylolisthesis with severe stenosis L4-5. MRI C-spine showed spondylosis which was stable. She was taken to the operating room on 4/14 where the patient underwent PLIF L4-5. The patient tolerated the procedure well and was taken to the recovery room and then to the floor in stable condition. The hospital course was routine. There were no complications. The wound remained clean dry and intact. Pt had appropriate back soreness. No complaints of leg pain or new N/T/W. The patient remained afebrile with stable vital signs, and tolerated a regular diet. The patient continued to increase activities, and pain was well controlled with oral pain medications.  ? ?Consults: None ? ?Significant Diagnostic Studies:  ?Results for orders placed or performed during the hospital encounter of 08/23/21  ?Surgical pcr screen  ? Specimen: Nasal Mucosa; Nasal Swab  ?Result Value Ref Range  ? MRSA, PCR NEGATIVE NEGATIVE  ? Staphylococcus aureus NEGATIVE NEGATIVE  ?Comprehensive metabolic panel  ?Result Value Ref Range  ? Sodium 139 135 - 145 mmol/L  ? Potassium 5.9 (H) 3.5 - 5.1 mmol/L  ? Chloride 107 98 - 111 mmol/L  ? CO2 24 22 - 32 mmol/L  ? Glucose, Bld 140 (H) 70 - 99 mg/dL  ? BUN 27 (H) 8 - 23 mg/dL  ? Creatinine, Ser 0.70 0.44 - 1.00 mg/dL  ? Calcium 9.3 8.9 - 10.3 mg/dL  ? Total Protein 5.9 (L) 6.5 - 8.1 g/dL  ? Albumin 3.5 3.5 - 5.0 g/dL  ? AST 47 (H) 15 - 41 U/L  ? ALT 26 0 - 44 U/L  ? Alkaline Phosphatase 42 38 - 126 U/L  ? Total Bilirubin 0.9 0.3 - 1.2 mg/dL  ? GFR, Estimated >60 >60 mL/min  ? Anion gap 8 5 - 15  ?CBC   ?Result Value Ref Range  ? WBC 10.8 (H) 4.0 - 10.5 K/uL  ? RBC 4.52 3.87 - 5.11 MIL/uL  ? Hemoglobin 13.3 12.0 - 15.0 g/dL  ? HCT 40.6 36.0 - 46.0 %  ? MCV 89.8 80.0 - 100.0 fL  ? MCH 29.4 26.0 - 34.0 pg  ? MCHC 32.8 30.0 - 36.0 g/dL  ? RDW 13.2 11.5 - 15.5 %  ? Platelets 292 150 - 400 K/uL  ? nRBC 0.0 0.0 - 0.2 %  ?Basic metabolic panel  ?Result Value Ref Range  ? Sodium 142 135 - 145 mmol/L  ? Potassium 4.6 3.5 - 5.1 mmol/L  ? Chloride 107 98 - 111 mmol/L  ? CO2 28 22 - 32 mmol/L  ? Glucose, Bld 125 (H) 70 - 99 mg/dL  ? BUN 25 (H) 8 - 23 mg/dL  ? Creatinine, Ser 0.68 0.44 - 1.00 mg/dL  ? Calcium 9.2 8.9 - 10.3 mg/dL  ? GFR, Estimated >60 >60 mL/min  ? Anion gap 7 5 - 15  ?Type and screen [place]  ?Result Value Ref Range  ? ABO/RH(D) A POS   ? Antibody Screen NEG   ? Sample Expiration    ?  08/30/2021,2359 ?Performed at [facility], [place] [address] [place], [place] [address] ?  ?ABO/Rh  ?Result Value Ref Range  ?  ABO/RH(D)    ?  A POS ?Performed at [facility], [place] [address], [place], [place] [address] ?  ? ? ?DG Lumbar Spine 2-3 Views ? ?Result Date: 08/27/2021 ?CLINICAL DATA:  L4-L5 lumbar interbody fusion, fluoroscopic assistance EXAM: LUMBAR SPINE - 2-3 VIEW; DG C-ARM 1-60 MIN-NO REPORT COMPARISON:  None. FLUOROSCOPY: Air kerma 43.15 mGy FINDINGS: Intraoperative fluoroscopic images of the lumbar spine demonstrate posterior discectomy and fusion of L4-L5. No obvious perihardware fracture or component malpositioning. IMPRESSION: Intraoperative fluoroscopic images of the lumbar spine demonstrate posterior discectomy and fusion of L4-L5. No obvious perihardware fracture or component malpositioning. Electronically Signed   By: Delanna Ahmadi M.D.   On: 08/27/2021 11:55  ? ?DG Lumbar Spine 2-3 Views ? ?Result Date: 08/23/2021 ?CLINICAL DATA:  Low back pain EXAM: LUMBAR SPINE - 2-3 VIEW COMPARISON:  08/19/2021 FINDINGS: Levoscoliosis. Five non rib-bearing lumbar type vertebra. Grade 1  anterolisthesis L4 on L5. Vertebral body heights are maintained. Moderate disc space narrowing at L1-L2 and L3-L4 with advanced disc space narrowing and degenerative change L2-L3. Mild degenerative changes L4 through S1. Facet degenerative changes of the lower lumbar spine. IMPRESSION: Scoliosis with degenerative change, most advanced at L2-L3 Electronically Signed   By: Donavan Foil M.D.   On: 08/23/2021 20:52  ? ?MR CERVICAL SPINE WO CONTRAST ? ?Result Date: 08/25/2021 ?CLINICAL DATA:  71 year old female with chronic neck pain and gait disturbance. C4-C5 cervical fusion on CT last year. EXAM: MRI CERVICAL SPINE WITHOUT CONTRAST TECHNIQUE: Multiplanar, multisequence MR imaging of the cervical spine was performed. No intravenous contrast was administered. COMPARISON:  Cervical spine CT 10/21/2020.  MRI 10/20/2020. FINDINGS: Alignment: Stable from last year, exaggerated lower cervical lordosis and straightening of upper cervical lordosis with mild degenerative anterolisthesis of C3 on C4, C7 on T1. Vertebrae: Ongoing degenerative appearing marrow edema in the left C1 and C2 vertebrae, with severe joint space loss there by CT last year (bone-on-bone appearance and vacuum phenomena). Superimposed degenerative appearing marrow heterogeneity in the odontoid, with regressed odontoid edema since last year. Chronic C4-C5 interbody ankylosis. Degenerative endplate marrow signal changes elsewhere with normal background bone marrow signal. Cord: Chronic cord compression at C4-C5 (series 6, image 13), although no definite associated abnormal cord signal and stable appearance to the MRI last year. Above and below that level no abnormal cord signal despite some additional degenerative cord mass effect. Visible upper thoracic spinal cord appears within normal limits. Posterior Fossa, vertebral arteries, paraspinal tissues: Cervicomedullary junction is within normal limits. Patchy left posterior cerebellar chronic encephalomalacia  is visible (series 2, image 11). Otherwise grossly negative visible posterior fossa and brain parenchyma. Major vascular flow voids in the neck appear preserved. Negative visible neck soft tissues and lung apices. Disc levels: C2-C3:  Negative. C3-C4: Anterolisthesis with moderate to severe disc space loss, right eccentric disc osteophyte complex, and severe right facet hypertrophy. Mild spinal stenosis and spinal cord mass effect does appear increased from last year on series 6, image 8. Moderate left and severe right C4 foraminal stenosis is stable. C4-C5: Chronic interbody ankylosis. But residual endplate spurring, and ligament flavum hypertrophy at the C5 level result in up to moderate spinal stenosis and spinal cord mass effect on series 2, image 7 and series 6, image 13. Mild to moderate C5 foraminal stenosis mostly on the left appears stable. C5-C6: Chronic disc space loss and circumferential disc osteophyte complex. Ligament flavum and facet hypertrophy. Mild spinal stenosis and spinal cord mass effect are stable to increased. Mild to  moderate left and moderate to severe right C6 foraminal stenosis is stable. C6-C7: Lesser disc bulging and endplate spurring here. Mild to moderate facet hypertrophy. No spinal stenosis. Mild to moderate right C7 foraminal stenosis is stable. C7-T1: Chronic anterolisthesis with disc bulging, moderate to severe facet and ligament flavum hypertrophy. No spinal stenosis. Mild bilateral C8 foraminal stenosis is stable. IMPRESSION: 1. Up to moderate degenerative spinal stenosis and spinal cord mass effect at C4-C5 does not appear significantly changed from the MRI last year, with cord compression but no definite abnormal cord signal. Underlying interbody ankylosis there but endplate spurring and posterior ligamentous hypertrophy contribute to stenosis. 2. Advanced cervical spine degeneration elsewhere, with mild progression of chronic multifactorial spinal stenosis and spinal cord  mass at C3-C4, C5-C6. Chronic moderate or severe bilateral C4, right C6 neural foraminal stenosis. 3. Chronic severe C1-C2 joint space loss on the left with ongoing associated marrow edema. 4. Small area

## 2021-08-29 NOTE — TOC Transition Note (Signed)
Transition of Care (TOC) - CM/SW Discharge Note ? ? ?Patient Details  ?Name: Bonnie Robinson ?MRN: [id number] ?Date of Birth: Aug 13, 1950 ? ?Transition of Care (TOC) CM/SW Contact:  ?Carles Collet, RN ?Phone Number: ?08/29/2021, 8:59 AM ? ? ?Clinical Narrative:    ?Patient cleared from PT, no HH PT indicated. Patient has walker cane and elevated toilets at home.  ?No other TOC needs identified for DC.  ? ? ? ? ?  ?Barriers to Discharge: Continued Medical Work up ? ? ?Patient Goals and CMS Choice ?  ?CMS Medicare.gov Compare Post Acute Care list provided to:: Patient ?Choice offered to / list presented to : Patient ? ?Discharge Placement ?  ?           ?  ?  ?  ?  ? ?Discharge Plan and Services ?  ?Discharge Planning Services: CM Consult ?Post Acute Care Choice: Home Health          ?  ?  ?  ?  ?  ?  ?  ?  ?  ?  ? ?Social Determinants of Health (SDOH) Interventions ?  ? ? ?Readmission Risk Interventions ?   ? View : No data to display.  ?  ?  ?  ? ? ? ? ? ?

## 2021-09-06 ENCOUNTER — Other Ambulatory Visit: Payer: Self-pay

## 2021-09-06 ENCOUNTER — Ambulatory Visit (INDEPENDENT_AMBULATORY_CARE_PROVIDER_SITE_OTHER)
Admission: RE | Admit: 2021-09-06 | Discharge: 2021-09-06 | Disposition: A | Discharge status: Home / Self Care | Payer: PPO | Source: Ambulatory Visit | Attending: Neurological Surgery | Admitting: Neurological Surgery

## 2021-09-06 ENCOUNTER — Encounter (HOSPITAL_COMMUNITY): Payer: Self-pay | Admitting: Emergency Medicine

## 2021-09-06 ENCOUNTER — Other Ambulatory Visit (HOSPITAL_COMMUNITY): Payer: Self-pay | Admitting: Neurosurgery

## 2021-09-06 ENCOUNTER — Emergency Department (HOSPITAL_COMMUNITY)
Admission: EM | Admit: 2021-09-06 | Discharge: 2021-09-06 | Disposition: A | Discharge status: Home / Self Care | Payer: PPO | Attending: Emergency Medicine | Admitting: Emergency Medicine

## 2021-09-06 DIAGNOSIS — R2243 Localized swelling, mass and lump, lower limb, bilateral: Secondary | ICD-10-CM | POA: Diagnosis not present

## 2021-09-06 DIAGNOSIS — Z7982 Long term (current) use of aspirin: Secondary | ICD-10-CM | POA: Diagnosis not present

## 2021-09-06 DIAGNOSIS — M7989 Other specified soft tissue disorders: Secondary | ICD-10-CM | POA: Diagnosis present

## 2021-09-06 DIAGNOSIS — I82492 Acute embolism and thrombosis of other specified deep vein of left lower extremity: Secondary | ICD-10-CM | POA: Diagnosis not present

## 2021-09-06 LAB — CBC WITH DIFFERENTIAL/PLATELET
Abs Immature Granulocytes: 0.03 10*3/uL (ref 0.00–0.07)
Basophils Absolute: 0 10*3/uL (ref 0.0–0.1)
Basophils Relative: 0 %
Eosinophils Absolute: 0 10*3/uL (ref 0.0–0.5)
Eosinophils Relative: 0 %
HCT: 38.2 % (ref 36.0–46.0)
Hemoglobin: 12 g/dL (ref 12.0–15.0)
Immature Granulocytes: 0 %
Lymphocytes Relative: 11 %
Lymphs Abs: 0.9 10*3/uL (ref 0.7–4.0)
MCH: 28.6 pg (ref 26.0–34.0)
MCHC: 31.4 g/dL (ref 30.0–36.0)
MCV: 91.2 fL (ref 80.0–100.0)
Monocytes Absolute: 0.6 10*3/uL (ref 0.1–1.0)
Monocytes Relative: 7 %
Neutro Abs: 6.8 10*3/uL (ref 1.7–7.7)
Neutrophils Relative %: 82 %
Platelets: 242 10*3/uL (ref 150–400)
RBC: 4.19 MIL/uL (ref 3.87–5.11)
RDW: 13 % (ref 11.5–15.5)
WBC: 8.3 10*3/uL (ref 4.0–10.5)
nRBC: 0 % (ref 0.0–0.2)

## 2021-09-06 LAB — COMPREHENSIVE METABOLIC PANEL
ALT: 34 U/L (ref 0–44)
AST: 21 U/L (ref 15–41)
Albumin: 3.1 g/dL — ABNORMAL LOW (ref 3.5–5.0)
Alkaline Phosphatase: 67 U/L (ref 38–126)
Anion gap: 9 (ref 5–15)
BUN: 15 mg/dL (ref 8–23)
CO2: 25 mmol/L (ref 22–32)
Calcium: 8.8 mg/dL — ABNORMAL LOW (ref 8.9–10.3)
Chloride: 104 mmol/L (ref 98–111)
Creatinine, Ser: 0.81 mg/dL (ref 0.44–1.00)
GFR, Estimated: >60 mL/min (ref >60)
Glucose, Bld: 107 mg/dL — ABNORMAL HIGH (ref 70–99)
Potassium: 3.8 mmol/L (ref 3.5–5.1)
Sodium: 138 mmol/L (ref 135–145)
Total Bilirubin: 0.3 mg/dL (ref 0.3–1.2)
Total Protein: 6.4 g/dL — ABNORMAL LOW (ref 6.5–8.1)

## 2021-09-06 MED ORDER — APIXABAN (ELIQUIS) VTE STARTER PACK (10MG AND 5MG)
ORAL_TABLET | ORAL | 0 refills | Status: DC
Start: 2021-09-06 — End: 2023-12-22

## 2021-09-06 MED ORDER — APIXABAN 5 MG PO TABS
10.0000 mg | ORAL_TABLET | Freq: Once | ORAL | Status: AC
  Administered 2021-09-06: 10 mg via ORAL
  Filled 2021-09-06: qty 2

## 2021-09-06 MED ORDER — APIXABAN (ELIQUIS) EDUCATION KIT FOR DVT/PE PATIENTS
PACK | Freq: Once | Status: AC
  Filled 2021-09-06: qty 1

## 2021-09-06 NOTE — Discharge Instructions (Addendum)
Return for any problem.  ? ?Information on my medicine - ELIQUIS? (apixaban) ? ?This medication education was reviewed with me or my healthcare representative as part of my discharge preparation.  The pharmacist that spoke with me during my hospital stay was:  ?Trayce Maino, Darnell Level, RPH ? ?Why was Eliquis? prescribed for you? ?Eliquis? was prescribed to treat blood clots that may have been found in the veins of your legs (deep vein thrombosis) or in your lungs (pulmonary embolism) and to reduce the risk of them occurring again. ? ?What do You need to know about Eliquis? ? ?The starting dose is 10 mg (two 5 mg tablets) taken TWICE daily for the FIRST SEVEN (7) DAYS, then on (enter date)  09/13/21  the dose is reduced to ONE 5 mg tablet taken TWICE daily.  Eliquis? may be taken with or without food.  ? ?Try to take the dose about the same time in the morning and in the evening. If you have difficulty swallowing the tablet whole please discuss with your pharmacist how to take the medication safely. ? ?Take Eliquis? exactly as prescribed and DO NOT stop taking Eliquis? without talking to the doctor who prescribed the medication.  Stopping may increase your risk of developing a new blood clot.  Refill your prescription before you run out. ? ?After discharge, you should have regular check-up appointments with your healthcare provider that is prescribing your Eliquis?. ?   ?What do you do if you miss a dose? ?If a dose of ELIQUIS? is not taken at the scheduled time, take it as soon as possible on the same day and twice-daily administration should be resumed. The dose should not be doubled to make up for a missed dose. ? ?Important Safety Information ?A possible side effect of Eliquis? is bleeding. You should call your healthcare provider right away if you experience any of the following: ?Bleeding from an injury or your nose that does not stop. ?Unusual colored urine (red or dark brown) or unusual colored stools (red or  black). ?Unusual bruising for unknown reasons. ?A serious fall or if you hit your head (even if there is no bleeding). ? ?Some medicines may interact with Eliquis? and might increase your risk of bleeding or clotting while on Eliquis?Marland Kitchen To help avoid this, consult your healthcare provider or pharmacist prior to using any new prescription or non-prescription medications, including herbals, vitamins, non-steroidal anti-inflammatory drugs (NSAIDs) and supplements. ? ?This website has more information on Eliquis? (apixaban): http://www.eliquis.com/eliquis/home  ?

## 2021-09-06 NOTE — ED Triage Notes (Signed)
Patient coming from dr, complaint of DVT left leg, confirmed by outpatient ultrasound today.  ?

## 2021-09-06 NOTE — ED Provider Triage Note (Signed)
Emergency Medicine Provider Triage Evaluation Note ? ?Bonnie Robinson , a 71 y.o. female  was evaluated in triage.  Pt complains of presents emergency department for complaint of swelling to left lower extremity.  Swelling has been there for a week or more.  Patient reports he received an ultrasound in the outpatient setting today that was positive for DVT.  Patient was sent to the emergency department to be started on blood thinner.  Patient denies any tenderness to lower extremity, chest pain, shortness of breath, hemoptysis ? ?Patient had surgery on 08/27/2021. ? ?Review of Systems  ?Positive: Leg swelling ?Negative: Chest pain, shortness of breath, hemoptysis, palpitation. ? ?Physical Exam  ?There were no vitals taken for this visit. ?Gen:   Awake, no distress   ?Resp:  Normal effort  ?MSK:   Moves extremities without difficulty  ?Other:  Swelling to left calf.  Pulse, motor, and sensation intact distally. ? ?Medical Decision Making  ?Medically screening exam initiated at 4:57 PM.  Appropriate orders placed.  KHRISTIE SAK was informed that the remainder of the evaluation will be completed by another provider, this initial triage assessment does not replace that evaluation, and the importance of remaining in the ED until their evaluation is complete. ? ?We will check patient's labs.  Patient to be started on blood thinner for DVT after that. ?  ?Loni Beckwith, PA-C ?09/06/21 1659 ? ?

## 2021-09-06 NOTE — ED Provider Notes (Signed)
?[facility] ?Provider Note ? ? ?CSN: [id number] ?Arrival date & time: 09/06/21  1527 ? ?  ? ?History ? ?Chief Complaint  ?Patient presents with  ? DVT  ? ? ?DONAE KUEKER is a 71 y.o. female. ? ?71 year old female with prior medical history as detailed below presents for evaluation.  Patient is known to Dr. Ronnald Ramp.  She is 10 days post PLIF L4-L5. ? ?She apparently refused to wear SCDs while inpatient. ? ?She has since developed a left leg swelling.  Patient was seen in the outpatient setting for same today.  Ultrasound revealed evidence of left lower extremity DVT. ? ?She denies associated chest pain or shortness of breath. ? ?She denies prior history of DVT. ? ?She denies issues with bleeding.  She specifically denies prior episodes of GI bleeding.  She has never been treated with Eliquis or other anticoagulation medications before. ? ?The history is provided by the patient and medical records.  ?Illness ?Location:  Left lower extremity swelling.  10 days postop.  Patient refused SCDs during inpatient stay.  Now with DVT confirmed on outpatient ultrasound. ?Severity:  Mild ?Onset quality:  Gradual ?Duration:  4 days ?Timing:  Constant ?Progression:  Unchanged ? ?  ? ?Home Medications ?Prior to Admission medications   ?Medication Sig Start Date End Date Taking? Authorizing Provider  ?acetaminophen (TYLENOL) 650 MG CR tablet Take 1,300 mg by mouth every 8 (eight) hours as needed for pain.    [provider]  ?aspirin 325 MG tablet Take 975 mg by mouth daily as needed for mild pain.    [provider]  ?CALCIUM-VITAMIN D PO Take 1 tablet by mouth in the morning and at bedtime.    [provider]  ?carbidopa-levodopa (PARCOPA) 10-100 MG disintegrating tablet Take 2-3 tablets by mouth daily as needed (restless leg). 04/07/21   [provider]  ?cyclobenzaprine (FLEXERIL) 5 MG tablet Take 1 tablet (5 mg total) by mouth 3 (three) times daily as  needed for muscle spasms. 08/29/21   Eustace Moore, MD  ?gabapentin (NEURONTIN) 300 MG capsule Take 300 mg by mouth 2 (two) times daily as needed (pain). Also takes '600mg'$  tablet at bedtime 08/18/21   [provider]  ?gabapentin (NEURONTIN) 600 MG tablet Take 600 mg by mouth at bedtime as needed (pain). Also takes '300mg'$  capsule twice daily 06/28/21   [provider]  ?Menthol, Topical Analgesic, (ICY HOT BACK EX) Apply 1 application. topically 2 (two) times daily as needed (pain).    [provider]  ?Menthol-Methyl Salicylate (SALONPAS PAIN RELIEF PATCH EX) Apply 1 patch topically daily as needed (pain).    [provider]  ?Multiple Vitamin (MULTIVITAMIN) tablet Take 1 tablet by mouth daily.    [provider]  ?Omega-3 Fatty Acids (FISH OIL PO) Take 1 capsule by mouth daily.    [provider]  ?omeprazole (PRILOSEC) 40 MG capsule Take 40 mg by mouth daily. 07/05/21   [provider]  ?oxyCODONE (OXY IR/ROXICODONE) 5 MG immediate release tablet Take 1 tablet (5 mg total) by mouth every 4 (four) hours as needed for severe pain ((score 7 to 10)). 08/29/21   Eustace Moore, MD  ?pramipexole (MIRAPEX) 1.5 MG tablet Take 4.5 mg by mouth every evening. 08/08/21   [provider]  ?rosuvastatin (CRESTOR) 5 MG tablet Take 5 mg by mouth daily. 06/22/21   [provider]  ?Zoledronic Acid (RECLAST IV) Inject 1 Dose into the vein See  admin instructions. Once a year    [provider]  ?   ? ?Allergies    ?Sulfa antibiotics   ? ?Review of Systems   ?Review of Systems  ?All other systems reviewed and are negative. ? ?Physical Exam ?Updated Vital Signs ?BP (!) 150/109 (BP Location: Left Arm)   Pulse (!) 104   Temp 98.2 ?F (36.8 ?C) (Oral)   Resp 15   SpO2 98%  ?Physical Exam ?Vitals and nursing note reviewed.  ?Constitutional:   ?   General: She is not in acute distress. ?   Appearance: Normal appearance. She is well-developed.  ?HENT:  ?    Head: Normocephalic and atraumatic.  ?Eyes:  ?   Conjunctiva/sclera: Conjunctivae normal.  ?   Pupils: Pupils are equal, round, and reactive to light.  ?Cardiovascular:  ?   Rate and Rhythm: Normal rate and regular rhythm.  ?   Heart sounds: Normal heart sounds.  ?Pulmonary:  ?   Effort: Pulmonary effort is normal. No respiratory distress.  ?   Breath sounds: Normal breath sounds.  ?Abdominal:  ?   General: There is no distension.  ?   Palpations: Abdomen is soft.  ?   Tenderness: There is no abdominal tenderness.  ?Musculoskeletal:     ?   General: No deformity. Normal range of motion.  ?   Cervical back: Normal range of motion and neck supple.  ?   Comments: Mild edema to the posterior aspect of the left lower extremity.  ?Skin: ?   General: Skin is warm and dry.  ?Neurological:  ?   General: No focal deficit present.  ?   Mental Status: She is alert and oriented to person, place, and time.  ? ? ?ED Results / Procedures / Treatments   ?Labs ?(all labs ordered are listed, but only abnormal results are displayed) ?Labs Reviewed  ?COMPREHENSIVE METABOLIC PANEL - Abnormal; Notable for the following components:  ?    Result Value  ? Glucose, Bld 107 (*)   ? Calcium 8.8 (*)   ? Total Protein 6.4 (*)   ? Albumin 3.1 (*)   ? All other components within normal limits  ?CBC WITH DIFFERENTIAL/PLATELET  ? ? ?EKG ?None ? ?Radiology ?VAS [place] LOWER EXTREMITY VENOUS (DVT) ? ?Result Date: 09/06/2021 ? Lower Venous DVT Study Patient Name:  BELICIA DIFATTA  Date of Exam:   09/06/2021 Medical Rec #: [id number]     Accession #:    [id number] Date of Birth: 1951/01/10    Patient Gender: F Patient Age:   34 years Exam Location:  [facility] Procedure:      VAS [place] LOWER EXTREMITY VENOUS (DVT) Referring Phys: Mallie Mussel POOL --------------------------------------------------------------------------------  Indications: Swelling. Patient states that legs have been swollen after back surgery. Right side has stopped now, but left side  still swelling in the calf area.  Comparison Study: None Performing Technologist: Ivan Croft  Examination Guidelines: A complete evaluation includes B-mode imaging, spectral Doppler, color Doppler, and power Doppler as needed of all accessible portions of each vessel. Bilateral testing is considered an integral part of a complete examination. Limited examinations for reoccurring indications may be performed as noted. The reflux portion of the exam is performed with the patient in reverse Trendelenburg.  +---------+---------------+---------+-----------+----------+--------------+ RIGHT    CompressibilityPhasicitySpontaneityPropertiesThrombus Aging +---------+---------------+---------+-----------+----------+--------------+ CFV      Full           Yes      Yes                                 +---------+---------------+---------+-----------+----------+--------------+  SFJ      Full                                                        +---------+---------------+---------+-----------+----------+--------------+ FV Prox  Full                                                        +---------+---------------+---------+-----------+----------+--------------+ FV Mid   Full           Yes      Yes                                 +---------+---------------+---------+-----------+----------+--------------+ FV DistalFull                                                        +---------+---------------+---------+-----------+----------+--------------+ PFV      Full                                                        +---------+---------------+---------+-----------+----------+--------------+ POP      Full           Yes      Yes                                 +---------+---------------+---------+-----------+----------+--------------+ PTV      Full                                                         +---------+---------------+---------+-----------+----------+--------------+ PERO     Full                                                        +---------+---------------+---------+-----------+----------+--------------+ Gastroc  Full                                                        +---------+---------------+---------+-----------+-----

## 2021-09-17 DIAGNOSIS — I82402 Acute embolism and thrombosis of unspecified deep veins of left lower extremity: Secondary | ICD-10-CM

## 2021-09-17 HISTORY — DX: Acute embolism and thrombosis of unspecified deep veins of left lower extremity: I82.402

## 2022-05-11 ENCOUNTER — Emergency Department (HOSPITAL_BASED_OUTPATIENT_CLINIC_OR_DEPARTMENT_OTHER): Payer: PPO

## 2022-05-11 ENCOUNTER — Emergency Department (HOSPITAL_BASED_OUTPATIENT_CLINIC_OR_DEPARTMENT_OTHER)
Admission: EM | Admit: 2022-05-11 | Discharge: 2022-05-11 | Disposition: A | Discharge status: Home / Self Care | Payer: PPO | Attending: Emergency Medicine | Admitting: Emergency Medicine

## 2022-05-11 ENCOUNTER — Other Ambulatory Visit: Payer: Self-pay

## 2022-05-11 DIAGNOSIS — R791 Abnormal coagulation profile: Secondary | ICD-10-CM | POA: Diagnosis not present

## 2022-05-11 DIAGNOSIS — Z7982 Long term (current) use of aspirin: Secondary | ICD-10-CM | POA: Diagnosis not present

## 2022-05-11 DIAGNOSIS — M7989 Other specified soft tissue disorders: Secondary | ICD-10-CM | POA: Diagnosis present

## 2022-05-11 DIAGNOSIS — Z7901 Long term (current) use of anticoagulants: Secondary | ICD-10-CM | POA: Insufficient documentation

## 2022-05-11 DIAGNOSIS — R600 Localized edema: Secondary | ICD-10-CM | POA: Insufficient documentation

## 2022-05-11 LAB — COMPREHENSIVE METABOLIC PANEL WITH GFR
ALT: 21 U/L (ref 0–44)
AST: 25 U/L (ref 15–41)
Albumin: 4 g/dL (ref 3.5–5.0)
Alkaline Phosphatase: 57 U/L (ref 38–126)
Anion gap: 11 (ref 5–15)
BUN: 21 mg/dL (ref 8–23)
CO2: 26 mmol/L (ref 22–32)
Calcium: 9.7 mg/dL (ref 8.9–10.3)
Chloride: 102 mmol/L (ref 98–111)
Creatinine, Ser: 0.67 mg/dL (ref 0.44–1.00)
GFR, Estimated: >60 mL/min
Glucose, Bld: 105 mg/dL — ABNORMAL HIGH (ref 70–99)
Potassium: 4 mmol/L (ref 3.5–5.1)
Sodium: 139 mmol/L (ref 135–145)
Total Bilirubin: 0.5 mg/dL (ref 0.3–1.2)
Total Protein: 6.8 g/dL (ref 6.5–8.1)

## 2022-05-11 LAB — CBC WITH DIFFERENTIAL/PLATELET
Abs Immature Granulocytes: 0.02 K/uL (ref 0.00–0.07)
Basophils Absolute: 0 K/uL (ref 0.0–0.1)
Basophils Relative: 1 %
Eosinophils Absolute: 0 K/uL (ref 0.0–0.5)
Eosinophils Relative: 0 %
HCT: 41 % (ref 36.0–46.0)
Hemoglobin: 13.2 g/dL (ref 12.0–15.0)
Immature Granulocytes: 0 %
Lymphocytes Relative: 17 %
Lymphs Abs: 1.1 K/uL (ref 0.7–4.0)
MCH: 28.8 pg (ref 26.0–34.0)
MCHC: 32.2 g/dL (ref 30.0–36.0)
MCV: 89.5 fL (ref 80.0–100.0)
Monocytes Absolute: 0.4 K/uL (ref 0.1–1.0)
Monocytes Relative: 6 %
Neutro Abs: 4.8 K/uL (ref 1.7–7.7)
Neutrophils Relative %: 76 %
Platelets: 224 K/uL (ref 150–400)
RBC: 4.58 MIL/uL (ref 3.87–5.11)
RDW: 12.7 % (ref 11.5–15.5)
WBC: 6.3 K/uL (ref 4.0–10.5)
nRBC: 0 % (ref 0.0–0.2)

## 2022-05-11 LAB — D-DIMER, QUANTITATIVE: D-Dimer, Quant: 0.71 ug/mL-FEU — ABNORMAL HIGH (ref 0.00–0.50)

## 2022-05-11 LAB — BRAIN NATRIURETIC PEPTIDE: B Natriuretic Peptide: 33.3 pg/mL (ref 0.0–100.0)

## 2022-05-11 NOTE — ED Provider Notes (Incomplete)
[facility] Provider Note   CSN: [id number] Arrival date & time: 05/11/22  1421     History {Add pertinent medical, surgical, social history, OB history to HPI:1} Chief Complaint  Patient presents with   Tingling    Bonnie Robinson is a 71 y.o. female with PMH HLD, RLS, provoked DVT, who presents to ED c/o episode of left lower leg swelling that started last night and had resolved by the time she woke up this morning. She denies any pain to the leg. She had LLE DVT following back surgery in April of this year. She was on Eliquis for 4 months following and then stopped at PCP's advice as LLE had known cause. She denies other recent surgery, recent travel, hormone use, or other complications since the surgery. She has some bilateral lower extremity tingling from the knees down but does have a history of RLS and denies any pain or swelling to right leg. She denies any back pain, dysuria, hematuria, saddle anesthesia, leg weakness, inability to walk, or back or leg injury. She denies chest pain, shortness of breath, palpitations, or history of PE. States otherwise she is feeling in her normal state of health.       Home Medications Prior to Admission medications   Medication Sig Start Date End Date Taking? Authorizing Provider  acetaminophen (TYLENOL) 650 MG CR tablet Take 1,300 mg by mouth every 8 (eight) hours as needed for pain.    [provider]  APIXABAN Arne Cleveland) VTE STARTER PACK ('10MG'$  AND '5MG'$ ) Take as directed on package: start with two-'5mg'$  tablets twice daily for 7 days. On day 8, switch to one-'5mg'$  tablet twice daily. 09/06/21   Valarie Merino, MD  aspirin 325 MG tablet Take 975 mg by mouth daily as needed for mild pain.    [provider]  CALCIUM-VITAMIN D PO Take 1 tablet by mouth in the morning and at bedtime.    [provider]  carbidopa-levodopa (PARCOPA) 10-100 MG disintegrating tablet Take 2-3 tablets by mouth daily as  needed (restless leg). 04/07/21   [provider]  cyclobenzaprine (FLEXERIL) 5 MG tablet Take 1 tablet (5 mg total) by mouth 3 (three) times daily as needed for muscle spasms. 08/29/21   Eustace Moore, MD  gabapentin (NEURONTIN) 300 MG capsule Take 300 mg by mouth 2 (two) times daily as needed (pain). Also takes '600mg'$  tablet at bedtime 08/18/21   [provider]  gabapentin (NEURONTIN) 600 MG tablet Take 600 mg by mouth at bedtime as needed (pain). Also takes '300mg'$  capsule twice daily 06/28/21   [provider]  Menthol, Topical Analgesic, (ICY HOT BACK EX) Apply 1 application. topically 2 (two) times daily as needed (pain).    [provider]  Menthol-Methyl Salicylate (SALONPAS PAIN RELIEF PATCH EX) Apply 1 patch topically daily as needed (pain).    [provider]  Multiple Vitamin (MULTIVITAMIN) tablet Take 1 tablet by mouth daily.    [provider]  Omega-3 Fatty Acids (FISH OIL PO) Take 1 capsule by mouth daily.    [provider]  omeprazole (PRILOSEC) 40 MG capsule Take 40 mg by mouth daily. 07/05/21   [provider]  oxyCODONE (OXY IR/ROXICODONE) 5 MG immediate release tablet Take 1 tablet (5 mg total) by mouth every 4 (four) hours as needed for severe pain ((score 7 to 10)). 08/29/21   Eustace Moore, MD  pramipexole (MIRAPEX) 1.5 MG tablet Take 4.5 mg by mouth every  evening. 08/08/21   [provider]  rosuvastatin (CRESTOR) 5 MG tablet Take 5 mg by mouth daily. 06/22/21   [provider]  Zoledronic Acid (RECLAST IV) Inject 1 Dose into the vein See admin instructions. Once a year    [provider]      Allergies    Sulfa antibiotics    Review of Systems   Review of Systems  Constitutional:  Negative for activity change, appetite change, chills and fever.  HENT:  Negative for ear pain and sore throat.   Eyes:  Negative for pain and visual disturbance.  Respiratory:  Negative for apnea,  cough, chest tightness, shortness of breath and wheezing.   Cardiovascular:  Positive for leg swelling. Negative for chest pain and palpitations.  Gastrointestinal:  Negative for abdominal pain, diarrhea, nausea and vomiting.  Genitourinary:  Negative for dysuria and hematuria.  Musculoskeletal:  Negative for arthralgias, back pain and gait problem.  Skin:  Negative for color change and rash.  Neurological:  Negative for dizziness, seizures, syncope, speech difficulty, weakness, light-headedness and headaches.  All other systems reviewed and are negative.   Physical Exam Updated Vital Signs BP (!) 147/84 (BP Location: Right Arm)   Pulse 75   Temp 97.8 F (36.6 C) (Oral)   Resp 16   Ht '5\' 2"'$  (1.575 m)   Wt 74.4 kg   SpO2 100%   BMI 30.00 kg/m  Physical Exam Vitals and nursing note reviewed.  Constitutional:      General: She is not in acute distress.    Appearance: Normal appearance.  HENT:     Head: Normocephalic and atraumatic.     Mouth/Throat:     Mouth: Mucous membranes are moist.  Eyes:     Conjunctiva/sclera: Conjunctivae normal.  Cardiovascular:     Rate and Rhythm: Normal rate and regular rhythm.     Heart sounds: No murmur heard. Pulmonary:     Effort: Pulmonary effort is normal. No respiratory distress.     Breath sounds: Normal breath sounds. No wheezing or rhonchi.  Musculoskeletal:        General: No swelling, tenderness, deformity or signs of injury. Normal range of motion.     Cervical back: Neck supple.     Right lower leg: No edema.     Left lower leg: No edema.     Comments: No midline CTL spine tenderness, no paraspinal muscle tenderness, able to change positions easily with no pain  Skin:    General: Skin is warm and dry.     Capillary Refill: Capillary refill takes less than 2 seconds.     Findings: No erythema or rash.  Neurological:     General: No focal deficit present.     Mental Status: She is alert.     Sensory: No sensory deficit.      Motor: No weakness.     Gait: Gait normal.  Psychiatric:        Mood and Affect: Mood normal.        Behavior: Behavior normal.     ED Results / Procedures / Treatments   Labs (all labs ordered are listed, but only abnormal results are displayed) Labs Reviewed  D-DIMER, QUANTITATIVE - Abnormal; Notable for the following components:      Result Value   D-Dimer, Quant 0.71 (*)    All other components within normal limits  COMPREHENSIVE METABOLIC PANEL - Abnormal; Notable for the following components:   Glucose, Bld 105 (*)  All other components within normal limits  CBC WITH DIFFERENTIAL/PLATELET  BRAIN NATRIURETIC PEPTIDE    EKG None  Radiology No results found.  Procedures None  Medications Ordered in ED Medications - No data to display  ED Course/ Medical Decision Making/ A&P                           Medical Decision Making Amount and/or Complexity of Data Reviewed Labs: ordered.   ***  {Document critical care time when appropriate:1} {Document review of labs and clinical decision tools ie heart score, Chads2Vasc2 etc:1}  {Document your independent review of radiology images, and any outside records:1} {Document your discussion with family members, caretakers, and with consultants:1} {Document social determinants of health affecting pt's care:1} {Document your decision making why or why not admission, treatments were needed:1} Final Clinical Impression(s) / ED Diagnoses Final diagnoses:  None    Rx / DC Orders ED Discharge Orders     None

## 2022-05-11 NOTE — Discharge Instructions (Signed)
Thank you for letting us take care of you today.  Thankfully, we did not identify a blood clot on the ultrasound of your leg. I recommend following up with your primary care provider for further evaluation of the tingling in both of your legs. I suspect this is related to your restless leg syndrome.  Please make sure to stay well hydrated and if you develop swelling, rest and elevate your legs for relief. You may also take over the counter ibuprofen as needed.  Should you develop chest pain, shortness of breath, pain in the leg, or other concerns, please return to your closest emergency department for re-evaluation.

## 2022-05-11 NOTE — ED Triage Notes (Signed)
Bilateral lower legs tingling x 4 days. Yesterday states left leg was swelling but has gone down today. Pt denies any pain. Had back surgery April 2023

## 2022-05-11 NOTE — ED Triage Notes (Signed)
Pt states hx of blood clots and was on eliquis but is no longer on blood thinner

## 2022-05-11 NOTE — ED Triage Notes (Signed)
Pt ambulated to triage without difficulty.

## 2022-10-12 ENCOUNTER — Ambulatory Visit (INDEPENDENT_AMBULATORY_CARE_PROVIDER_SITE_OTHER): Payer: PPO | Admitting: Dermatology

## 2022-10-12 ENCOUNTER — Encounter: Payer: Self-pay | Admitting: Dermatology

## 2022-10-12 DIAGNOSIS — X32XXXA Exposure to sunlight, initial encounter: Secondary | ICD-10-CM

## 2022-10-12 DIAGNOSIS — L814 Other melanin hyperpigmentation: Secondary | ICD-10-CM | POA: Diagnosis not present

## 2022-10-12 DIAGNOSIS — L578 Other skin changes due to chronic exposure to nonionizing radiation: Secondary | ICD-10-CM

## 2022-10-12 DIAGNOSIS — W908XXA Exposure to other nonionizing radiation, initial encounter: Secondary | ICD-10-CM

## 2022-10-12 DIAGNOSIS — Z1283 Encounter for screening for malignant neoplasm of skin: Secondary | ICD-10-CM | POA: Diagnosis not present

## 2022-10-12 DIAGNOSIS — D1801 Hemangioma of skin and subcutaneous tissue: Secondary | ICD-10-CM

## 2022-10-12 DIAGNOSIS — L821 Other seborrheic keratosis: Secondary | ICD-10-CM | POA: Diagnosis not present

## 2022-10-12 DIAGNOSIS — D229 Melanocytic nevi, unspecified: Secondary | ICD-10-CM

## 2022-10-12 NOTE — Progress Notes (Signed)
   New Patient Visit   Subjective  Bonnie Robinson is a 72 y.o. female who presents for the following: Skin Cancer Screening and Full Body Skin Exam  Patient has previously see a dermatologist. Last skin check was about three years ago. Never had anything removed. No family hx of skin cancer. Brown spot above left eyebrow. Denies itchiness but has grown. Red spot under eye that is not bothersome.   The patient presents for Total-Body Skin Exam (TBSE) for skin cancer screening and mole check. The patient has spots, moles and lesions to be evaluated, some may be new or changing and the patient has concerns that these could be cancer.    The following portions of the chart were reviewed this encounter and updated as appropriate: medications, allergies, medical history  Review of Systems:  No other skin or systemic complaints except as noted in HPI or Assessment and Plan.  Objective  Well appearing patient in no apparent distress; mood and affect are within normal limits.  A full examination was performed including scalp, head, eyes, ears, nose, lips, neck, chest, axillae, abdomen, back, buttocks, bilateral upper extremities, bilateral lower extremities, hands, feet, fingers, toes, fingernails, and toenails. All findings within normal limits unless otherwise noted below.   Relevant physical exam findings are noted in the Assessment and Plan.    Assessment & Plan   LENTIGINES, SEBORRHEIC KERATOSES, HEMANGIOMAS - Benign normal skin lesions - Benign-appearing - Call for any changes  MELANOCYTIC NEVI - Tan-brown and/or pink-flesh-colored symmetric macules and papules - Benign appearing on exam today - Observation - Call clinic for new or changing moles - Recommend daily use of broad spectrum spf 30+ sunscreen to sun-exposed areas.   ACTINIC DAMAGE - Chronic condition, secondary to cumulative UV/sun exposure - diffuse scaly erythematous macules with underlying dyspigmentation - Recommend  daily broad spectrum sunscreen SPF 30+ to sun-exposed areas, reapply every 2 hours as needed.  - Staying in the shade or wearing long sleeves, sun glasses (UVA+UVB protection) and wide brim hats (4-inch brim around the entire circumference of the hat) are also recommended for sun protection.  - Call for new or changing lesions.  SKIN CANCER SCREENING PERFORMED TODAY.    Actinic skin damage  Cherry angioma  Multiple benign melanocytic nevi  Skin exam for malignant neoplasm  Seborrheic keratosis    Return in about 1 year (around 10/12/2023) for FBSE.    Documentation: I have reviewed the above documentation for accuracy and completeness, and I agree with the above.  Langston Reusing, DO  I, Germaine Pomfret, CMA, am acting as scribe for [employer], DO.

## 2022-10-12 NOTE — Patient Instructions (Signed)
Due to recent changes in healthcare laws, you may see results of your pathology and/or laboratory studies on MyChart before the doctors have had a chance to review them. We understand that in some cases there may be results that are confusing or concerning to you. Please understand that not all results are received at the same time and often the doctors may need to interpret multiple results in order to provide you with the best plan of care or course of treatment. Therefore, we ask that you please give us 2 business days to thoroughly review all your results before contacting the office for clarification. Should we see a critical lab result, you will be contacted sooner.   If You Need Anything After Your Visit  If you have any questions or concerns for your doctor, please call our main line at [phone] If no one answers, please leave a voicemail as directed and we will return your call as soon as possible. Messages left after 4 pm will be answered the following business day.   You may also send us a message via MyChart. We typically respond to MyChart messages within 1-2 business days.  For prescription refills, please ask your pharmacy to contact our office. Our fax number is [phone].  If you have an urgent issue when the clinic is closed that cannot wait until the next business day, you can page your doctor at the number below.    Please note that while we do our best to be available for urgent issues outside of office hours, we are not available 24/7.   If you have an urgent issue and are unable to reach us, you may choose to seek medical care at your doctor's office, retail clinic, urgent care center, or emergency room.  If you have a medical emergency, please immediately call 911 or go to the emergency department. In the event of inclement weather, please call our main line at [phone] for an update on the status of any delays or closures.  Dermatology Medication Tips: Please  keep the boxes that topical medications come in in order to help keep track of the instructions about where and how to use these. Pharmacies typically print the medication instructions only on the boxes and not directly on the medication tubes.   If your medication is too expensive, please contact our office at [phone] or send us a message through MyChart.   We are unable to tell what your co-pay for medications will be in advance as this is different depending on your insurance coverage. However, we may be able to find a substitute medication at lower cost or fill out paperwork to get insurance to cover a needed medication.   If a prior authorization is required to get your medication covered by your insurance company, please allow us 1-2 business days to complete this process.  Drug prices often vary depending on where the prescription is filled and some pharmacies may offer cheaper prices.  The website www.goodrx.com contains coupons for medications through different pharmacies. The prices here do not account for what the cost may be with help from insurance (it may be cheaper with your insurance), but the website can give you the price if you did not use any insurance.  - You can print the associated coupon and take it with your prescription to the pharmacy.  - You may also stop by our office during regular business hours and pick up a GoodRx coupon card.  - If you need your   prescription sent electronically to a different pharmacy, notify our office through [facility] MyChart or by phone at [phone]     

## 2023-06-28 ENCOUNTER — Other Ambulatory Visit: Payer: Self-pay | Admitting: Medical Genetics

## 2023-08-10 ENCOUNTER — Encounter: Payer: Self-pay | Admitting: Neurology

## 2023-08-16 NOTE — Progress Notes (Signed)
 Initial neurology clinic note  Reason for Evaluation: Consultation requested by Lorenda Ishihara for an opinion regarding restless legs. My final recommendations will be communicated back to the requesting physician by way of shared medical record or letter to requesting physician via [place] mail.  HPI: This is Ms. Bonnie Robinson, a 73 y.o. right-handed female with a medical history of HLD, GERD, lumbar spine surgery (08/2021), DVT who presents to neurology clinic with the chief complaint of restless legs. The patient is accompanied by daughter.  Patient's symptoms started when she was 73 years old. She describes a restless sensation, mostly in her legs. Moving helps or concentrating on something helps the most. She does crafts all the time to help with symptoms. Symptoms start in the afternoon. If she starts just sitting, symptoms will prevent her for sitting still. Her mother had RLS and would occasionally give her Sinemet. That would help.  She was prescribed pramipexole in the 1990s. It started at a low dose and is currently 4.5 mg daily, having been increased over the years. She had a sleep study back then. She only slept a few hours and was able to go home in the middle of the night due to clear results. Patient also has been taking levodopa-carbidopa 10-100 mg for many years.  Patient takes pramipexole 4.5 mg at 5 pm in the evening. She used to take this later in the past.   Patient will take levodopa-carbidopa only when needed, maybe 4-5 times per month.  She takes iron (natural beet iron).  Patient is overall doing okay currently, but can have bad nights. She denies tremors, freezing, imbalance, or falls. She will occasionally have abnormal movement of head which she attributes to her neck.  Patient is needing to take the medication at higher and higher doses and earlier at night. She does not think it is spreading to new parts of her body. It is only rarely in her arms, but been like  this since she was 21.  She endorses poor sleep. She goes to bed around 11p-12a. She will get up at 3-5am. She endorses limb movements in her sleep. She may snore occasionally. She thinks she is well rested when she wakes up. She is not tired throughout the day. She does drink a lot of caffeine though.  Patient was asking PCP about a sleep specialist. She is going to see someone in [place] who told her that he could cure it.  Patient has been on gabapentin in the past due to pain in back and legs. She does not know if this changes RLS symptoms. She does not remember any side effects.  She has numbness and tingling in her legs, which she states started last year (about 1 year ago). She also has tingling in her neck (due to cervical spine issues per patient).  She does not report any constitutional symptoms like fever, night sweats, anorexia or unintentional weight loss.  EtOH use: 2-3 glasses of wine once a week  Restrictive diet? Intermittent fasting, but good variety Family history of neuropathy/myopathy/neurologic disease? Mother with RLS, other daughter and son have RLS  She may have had an EMG in the past (sound familiar to her) but is not sure and would not know the results.   MEDICATIONS:  Outpatient Encounter Medications as of 08/24/2023  Medication Sig   acetaminophen (TYLENOL) 650 MG CR tablet Take 1,300 mg by mouth every 8 (eight) hours as needed for pain.   carbidopa-levodopa (PARCOPA) 10-100 MG  disintegrating tablet Take 2 tablets by mouth daily as needed (restless leg). 2 as needed   Menthol-Methyl Salicylate (SALONPAS PAIN RELIEF PATCH EX) Apply 1 patch topically daily as needed (pain).   omeprazole (PRILOSEC) 40 MG capsule Take 40 mg by mouth daily. As needed   pramipexole (MIRAPEX) 1.5 MG tablet Take 4.5 mg by mouth every evening.   rosuvastatin (CRESTOR) 5 MG tablet Take 5 mg by mouth daily.   APIXABAN (ELIQUIS) VTE STARTER PACK (10MG  AND 5MG ) Take as directed on package:  start with two-5mg  tablets twice daily for 7 days. On day 8, switch to one-5mg  tablet twice daily. (Patient not taking: Reported on 08/24/2023)   aspirin 325 MG tablet Take 975 mg by mouth daily as needed for mild pain. (Patient not taking: Reported on 08/24/2023)   CALCIUM-VITAMIN D PO Take 1 tablet by mouth in the morning and at bedtime. (Patient not taking: Reported on 08/24/2023)   cyclobenzaprine (FLEXERIL) 5 MG tablet Take 1 tablet (5 mg total) by mouth 3 (three) times daily as needed for muscle spasms. (Patient not taking: Reported on 08/24/2023)   gabapentin (NEURONTIN) 300 MG capsule Take 300 mg by mouth 2 (two) times daily as needed (pain). Also takes 600mg  tablet at bedtime (Patient not taking: Reported on 08/24/2023)   gabapentin (NEURONTIN) 600 MG tablet Take 600 mg by mouth at bedtime as needed (pain). Also takes 300mg  capsule twice daily (Patient not taking: Reported on 08/24/2023)   Menthol, Topical Analgesic, (ICY HOT BACK EX) Apply 1 application. topically 2 (two) times daily as needed (pain). (Patient not taking: Reported on 08/24/2023)   Multiple Vitamin (MULTIVITAMIN) tablet Take 1 tablet by mouth daily. (Patient not taking: Reported on 08/24/2023)   Omega-3 Fatty Acids (FISH OIL PO) Take 1 capsule by mouth daily. (Patient not taking: Reported on 08/24/2023)   oxyCODONE (OXY IR/ROXICODONE) 5 MG immediate release tablet Take 1 tablet (5 mg total) by mouth every 4 (four) hours as needed for severe pain ((score 7 to 10)). (Patient not taking: Reported on 08/24/2023)   Zoledronic Acid (RECLAST IV) Inject 1 Dose into the vein See admin instructions. Once a year (Patient not taking: Reported on 08/24/2023)   No facility-administered encounter medications on file as of 08/24/2023.    PAST MEDICAL HISTORY: Past Medical History:  Diagnosis Date   High cholesterol     PAST SURGICAL HISTORY: Past Surgical History:  Procedure Laterality Date   NECK EXPLORATION      ALLERGIES: Allergies   Allergen Reactions   Sulfa Antibiotics Other (See Comments)    Childhood allergy    FAMILY HISTORY: Family History  Problem Relation Age of Onset   Cancer Mother    Heart failure Father     SOCIAL HISTORY: Social History   Tobacco Use   Smoking status: Never   Smokeless tobacco: Never  Vaping Use   Vaping status: Never Used  Substance Use Topics   Alcohol use: Yes    Comment: occasionally   Drug use: Never   Social History   Social History Narrative   Are you right handed or left handed? Right   Are you currently employed ?    What is your current occupation? retired   Do you live at home alone? yes   Who lives with you?    What type of home do you live in: 1 story or 2 story? two    Caffiene 5 cups     OBJECTIVE: PHYSICAL EXAM: BP 128/76   Pulse  91   Ht 5\' 2"  (1.575 m)   Wt 157 lb (71.2 kg)   SpO2 97%   BMI 28.72 kg/m   General: General appearance: Awake and alert. No distress. Cooperative with exam.  Skin: No obvious rash or jaundice. HEENT: Atraumatic. Anicteric. Lungs: Non-labored breathing on room air  Heart: Regular Extremities: No edema. Psych: Affect appropriate.  Neurological: Mental Status: Alert. Speech fluent. No pseudobulbar affect Cranial Nerves: CNII: No RAPD. Visual fields grossly intact. CNIII, IV, VI: PERRL. No nystagmus. EOMI. CN V: Facial sensation intact bilaterally to fine touch. CN VII: Facial muscles symmetric and strong. No ptosis at rest. CN VIII: Hearing grossly intact bilaterally. CN IX: No hypophonia. CN X: Palate elevates symmetrically. CN XI: Full strength shoulder shrug bilaterally. CN XII: Tongue protrusion full and midline. No atrophy or fasciculations. No significant dysarthria Motor: Tone is normal. ?Abnormal movements of jaw No atrophy. Strength is 5/5 in bilateral upper and lower extremities Reflexes:  Right Left   Bicep 2+ 2+   Tricep 2+ 2+   BrRad 2+ 2+   Knee 2+ 2+   Ankle 1+ 1+     Pathological Reflexes: Babinski: mute response bilaterally Hoffman: absent bilaterally Troemner: absent bilaterally Sensation: Pinprick: Intact in all extremities Vibration: intact in all extremities Proprioception: Intact in bilateral great toes Coordination: Intact finger-to- nose-finger bilaterally. Romberg negative. Gait: Able to rise from chair with arms crossed unassisted. Normal, narrow-based gait.  Lab and Test Review: External labs: 07/06/22: CBC w/ diff unremarkable CMP unremarkable TSH wnl Lipid panel: tChol 246, LDL 131, TG 66 Ferritin 44  HbA1c (06/30/21): 5.5 Vit D (02/23/21) wnl B12 (01/07/20): 357  Imaging/Procedures: MRI thoracic spine wo contrast (08/28/21): IMPRESSION: 1. Exaggerated thoracic kyphosis with mild multilevel upper thoracic spondylolisthesis T2-T3 through T4-T5. Associated disc and posterior element degeneration at those levels with borderline to mild spinal stenosis at the latter.   2. Capacious thoracic spinal canal elsewhere. Normal thoracic spinal cord.   3. Lower thoracic facet hypertrophy results in moderate to severe left greater than right T10 neural foraminal stenosis.  MRI cervical spine wo contrast (08/25/21): IMPRESSION: 1. Up to moderate degenerative spinal stenosis and spinal cord mass effect at C4-C5 does not appear significantly changed from the MRI last year, with cord compression but no definite abnormal cord signal. Underlying interbody ankylosis there but endplate spurring and posterior ligamentous hypertrophy contribute to stenosis.   2. Advanced cervical spine degeneration elsewhere, with mild progression of chronic multifactorial spinal stenosis and spinal cord mass at C3-C4, C5-C6. Chronic moderate or severe bilateral C4, right C6 neural foraminal stenosis.   3. Chronic severe C1-C2 joint space loss on the left with ongoing associated marrow edema.   4. Small area of chronic cerebellar encephalomalacia  suspected on the left.  MRI lumbar spine wo contrast (08/23/21): IMPRESSION: 1. Grade 1 anterolisthesis of L4 on L5 with associated advanced facet arthropathy and disc bulge resulting in severe spinal canal stenosis with impingement of the cauda equina nerve roots and mild-to-moderate left and mild right neural foraminal stenosis. 2. Subarticular zone narrowing on the right at L1-L2 and L2-L3, bilaterally at L3-L4 (left worse than right), and on the left at L5-S1 with potential irritation of the traversing nerve roots. There is also mild-to-moderate left neural foraminal stenosis at L5-S1. 3. Levoscoliosis centered at L3.  ASSESSMENT: Bonnie Robinson is a 73 y.o. female who presents for evaluation of restless legs. She has a relevant medical history of HLD, GERD, lumbar spine surgery (08/2021), DVT. Her  neurological examination is essentially normal. Available diagnostic data is significant for ferritin in 2024 of 44. MRI cervical spine shows chronic spinal stenosis and neural foraminal stenosis. MRI lumbar spine similarly shows multilevel stenosis. Patient's symptoms are consistent with restless leg syndrome. She may also have periodic limb movement disorder of sleep. Her symptoms are very long standing. She has been on levodopa-carbidopa and pramipexole for decades. She has needed higher doses and earlier dosing as symptoms continued to worsen. This is likely due to augmentation. I explained that the new guidelines recommend staying away from dopaminergic medications and instead prefer gabapentin or Lyrica. She may also have abnormal movements from too much dopamine.  PLAN: -Blood work: Iron studies, B1, B12, IFE -Start gabapentin 300 mg at bedtime -Reduce pramipexole to 3.0 mg in evening in 1 week -Stop levodopa-carbidopa  -Return to clinic in 3 months  The impression above as well as the plan as outlined below were extensively discussed with the patient (in the company of daughter) who  voiced understanding. All questions were answered to their satisfaction.  When available, results of the above investigations and possible further recommendations will be communicated to the patient via telephone/MyChart. Patient to call office if not contacted after expected testing turnaround time.   Total time spent reviewing records, interview, history/exam, documentation, and coordination of care on day of encounter:  70 min   Thank you for allowing me to participate in patient's care.  If I can answer any additional questions, I would be pleased to do so.  Jacquelyne Balint, MD   CC: Patient, No Pcp Per No address on file  CC: Referring provider: Varadarajan, Rupashree No address on file

## 2023-08-23 ENCOUNTER — Emergency Department (HOSPITAL_COMMUNITY)
Admission: EM | Admit: 2023-08-23 | Discharge: 2023-08-23 | Disposition: A | Discharge status: Home / Self Care | Attending: Emergency Medicine | Admitting: Emergency Medicine

## 2023-08-23 ENCOUNTER — Emergency Department (HOSPITAL_COMMUNITY)

## 2023-08-23 ENCOUNTER — Encounter (HOSPITAL_COMMUNITY): Payer: Self-pay

## 2023-08-23 DIAGNOSIS — R0789 Other chest pain: Secondary | ICD-10-CM | POA: Diagnosis present

## 2023-08-23 DIAGNOSIS — R531 Weakness: Secondary | ICD-10-CM | POA: Insufficient documentation

## 2023-08-23 DIAGNOSIS — R42 Dizziness and giddiness: Secondary | ICD-10-CM | POA: Insufficient documentation

## 2023-08-23 DIAGNOSIS — R519 Headache, unspecified: Secondary | ICD-10-CM | POA: Diagnosis not present

## 2023-08-23 DIAGNOSIS — R079 Chest pain, unspecified: Secondary | ICD-10-CM

## 2023-08-23 DIAGNOSIS — M542 Cervicalgia: Secondary | ICD-10-CM | POA: Diagnosis not present

## 2023-08-23 LAB — TROPONIN I (HIGH SENSITIVITY)
Troponin I (High Sensitivity): 4 ng/L (ref <18)
Troponin I (High Sensitivity): 4 ng/L (ref <18)

## 2023-08-23 LAB — COMPREHENSIVE METABOLIC PANEL WITH GFR
ALT: 19 U/L (ref 0–44)
AST: 22 U/L (ref 15–41)
Albumin: 4.4 g/dL (ref 3.5–5.0)
Alkaline Phosphatase: 51 U/L (ref 38–126)
Anion gap: 9 (ref 5–15)
BUN: 17 mg/dL (ref 8–23)
CO2: 25 mmol/L (ref 22–32)
Calcium: 9.5 mg/dL (ref 8.9–10.3)
Chloride: 103 mmol/L (ref 98–111)
Creatinine, Ser: 0.62 mg/dL (ref 0.44–1.00)
GFR, Estimated: >60 mL/min (ref >60)
Glucose, Bld: 105 mg/dL — ABNORMAL HIGH (ref 70–99)
Potassium: 3.8 mmol/L (ref 3.5–5.1)
Sodium: 137 mmol/L (ref 135–145)
Total Bilirubin: 0.8 mg/dL (ref 0.0–1.2)
Total Protein: 6.9 g/dL (ref 6.5–8.1)

## 2023-08-23 LAB — URINALYSIS, ROUTINE W REFLEX MICROSCOPIC
Bacteria, UA: NONE SEEN
Bilirubin Urine: NEGATIVE
Glucose, UA: NEGATIVE mg/dL
Hgb urine dipstick: NEGATIVE
Ketones, ur: NEGATIVE mg/dL
Nitrite: NEGATIVE
Protein, ur: NEGATIVE mg/dL
Specific Gravity, Urine: 1.017 (ref 1.005–1.030)
pH: 7 (ref 5.0–8.0)

## 2023-08-23 LAB — CBC WITH DIFFERENTIAL/PLATELET
Abs Immature Granulocytes: 0.02 10*3/uL (ref 0.00–0.07)
Basophils Absolute: 0 10*3/uL (ref 0.0–0.1)
Basophils Relative: 1 %
Eosinophils Absolute: 0 10*3/uL (ref 0.0–0.5)
Eosinophils Relative: 0 %
HCT: 45.2 % (ref 36.0–46.0)
Hemoglobin: 14.1 g/dL (ref 12.0–15.0)
Immature Granulocytes: 0 %
Lymphocytes Relative: 13 %
Lymphs Abs: 0.8 10*3/uL (ref 0.7–4.0)
MCH: 28.7 pg (ref 26.0–34.0)
MCHC: 31.2 g/dL (ref 30.0–36.0)
MCV: 92.1 fL (ref 80.0–100.0)
Monocytes Absolute: 0.4 10*3/uL (ref 0.1–1.0)
Monocytes Relative: 7 %
Neutro Abs: 4.8 10*3/uL (ref 1.7–7.7)
Neutrophils Relative %: 79 %
Platelets: 247 10*3/uL (ref 150–400)
RBC: 4.91 MIL/uL (ref 3.87–5.11)
RDW: 12.9 % (ref 11.5–15.5)
WBC: 6 10*3/uL (ref 4.0–10.5)
nRBC: 0 % (ref 0.0–0.2)

## 2023-08-23 LAB — CBG MONITORING, ED: Glucose-Capillary: 123 mg/dL — ABNORMAL HIGH (ref 70–99)

## 2023-08-23 MED ORDER — LACTATED RINGERS IV BOLUS
1000.0000 mL | Freq: Once | INTRAVENOUS | Status: AC
  Administered 2023-08-23: 1000 mL via INTRAVENOUS

## 2023-08-23 NOTE — ED Provider Notes (Signed)
 [place] EMERGENCY DEPARTMENT AT [facility] Provider Note   CSN: [id number] Arrival date & time: 08/23/23  0754     History  Chief Complaint  Patient presents with   Dizziness    Bonnie Robinson is a 73 y.o. female.  HPI 73 yo female ho hypercholestermia, dvt, no longer on anticoagulants, presents today with chest discomfort that began about an hour ago.  Describes as heartburn at 5/10, no radiation, no sob or associated symptoms.  Some minimal heartburn inpast associated with certain foods, but did not usually last that long. Chest discomfort that lasted about 15 minutes and stopped on its own.  Lightheadedness and weakness and shaky inside began.  LH worse but now able to walk.  Brought down from husband's bedside in wheelchair.  Husband being moved to palliative care with recent diagnosis stage lymphoma.  Patient has been at bedside and coping with this new diagnosis with rapid deterioration for the past 3 weeks.  Patient not taking great care of herself with decrease sleep and poor diet. No fever,  Headache now and neck pain chronic.  She states she gets headaches with neck pain.      Home Medications Prior to Admission medications   Medication Sig Start Date End Date Taking? Authorizing Provider  acetaminophen (TYLENOL) 650 MG CR tablet Take 1,300 mg by mouth every 8 (eight) hours as needed for pain.    [provider]  APIXABAN Everlene Balls) VTE STARTER PACK (10MG  AND 5MG ) Take as directed on package: start with two-5mg  tablets twice daily for 7 days. On day 8, switch to one-5mg  tablet twice daily. 09/06/21   Wynetta Fines, MD  aspirin 325 MG tablet Take 975 mg by mouth daily as needed for mild pain.    [provider]  CALCIUM-VITAMIN D PO Take 1 tablet by mouth in the morning and at bedtime.    [provider]  carbidopa-levodopa (PARCOPA) 10-100 MG disintegrating tablet Take 2-3 tablets by mouth daily as needed (restless leg). 04/07/21    [provider]  cyclobenzaprine (FLEXERIL) 5 MG tablet Take 1 tablet (5 mg total) by mouth 3 (three) times daily as needed for muscle spasms. 08/29/21   Arman Bogus, MD  gabapentin (NEURONTIN) 300 MG capsule Take 300 mg by mouth 2 (two) times daily as needed (pain). Also takes 600mg  tablet at bedtime 08/18/21   [provider]  gabapentin (NEURONTIN) 600 MG tablet Take 600 mg by mouth at bedtime as needed (pain). Also takes 300mg  capsule twice daily 06/28/21   [provider]  Menthol, Topical Analgesic, (ICY HOT BACK EX) Apply 1 application. topically 2 (two) times daily as needed (pain).    [provider]  Menthol-Methyl Salicylate (SALONPAS PAIN RELIEF PATCH EX) Apply 1 patch topically daily as needed (pain).    [provider]  Multiple Vitamin (MULTIVITAMIN) tablet Take 1 tablet by mouth daily.    [provider]  Omega-3 Fatty Acids (FISH OIL PO) Take 1 capsule by mouth daily.    [provider]  omeprazole (PRILOSEC) 40 MG capsule Take 40 mg by mouth daily. 07/05/21   [provider]  oxyCODONE (OXY IR/ROXICODONE) 5 MG immediate release tablet Take 1 tablet (5 mg total) by mouth every 4 (four) hours as needed for severe pain ((score 7 to 10)). 08/29/21   Arman Bogus, MD  pramipexole (MIRAPEX) 1.5 MG tablet Take 4.5 mg by mouth every evening. 08/08/21   [provider]  rosuvastatin (  CRESTOR) 5 MG tablet Take 5 mg by mouth daily. 06/22/21   [provider]  Zoledronic Acid (RECLAST IV) Inject 1 Dose into the vein See admin instructions. Once a year    [provider]      Allergies    Sulfa antibiotics    Review of Systems   Review of Systems  Physical Exam Updated Vital Signs BP 128/83   Pulse 80   Temp 97.7 F (36.5 C) (Oral)   Resp 20   Ht 1.575 m (5\' 2" )   Wt 74.8 kg   SpO2 100%   BMI 30.18 kg/m  Physical Exam Vitals reviewed.  Constitutional:      Appearance: Normal  appearance.  HENT:     Head: Normocephalic.     Right Ear: External ear normal.     Left Ear: External ear normal.     Nose: Nose normal.     Mouth/Throat:     Mouth: Mucous membranes are moist.     Pharynx: Oropharyngeal exudate present.  Eyes:     Extraocular Movements: Extraocular movements intact.     Pupils: Pupils are equal, round, and reactive to light.  Cardiovascular:     Rate and Rhythm: Normal rate and regular rhythm.     Pulses: Normal pulses.  Pulmonary:     Effort: Pulmonary effort is normal.  Abdominal:     General: Abdomen is flat. Bowel sounds are normal.  Musculoskeletal:        General: Normal range of motion.     Cervical back: Normal range of motion.  Skin:    General: Skin is warm and dry.     Capillary Refill: Capillary refill takes less than 2 seconds.  Neurological:     General: No focal deficit present.     Mental Status: She is alert.     Cranial Nerves: No cranial nerve deficit.     Motor: No weakness.  Psychiatric:        Mood and Affect: Mood normal.     ED Results / Procedures / Treatments   Labs (all labs ordered are listed, but only abnormal results are displayed) Labs Reviewed  COMPREHENSIVE METABOLIC PANEL WITH GFR - Abnormal; Notable for the following components:      Result Value   Glucose, Bld 105 (*)    All other components within normal limits  URINALYSIS, ROUTINE W REFLEX MICROSCOPIC - Abnormal; Notable for the following components:   Leukocytes,Ua LARGE (*)    All other components within normal limits  CBC WITH DIFFERENTIAL/PLATELET  TROPONIN I (HIGH SENSITIVITY)  TROPONIN I (HIGH SENSITIVITY)    EKG EKG Interpretation Date/Time:  Wednesday August 23 2023 08:03:28 EDT Ventricular Rate:  85 PR Interval:  143 QRS Duration:  91 QT Interval:  357 QTC Calculation: 425 R Axis:   59  Text Interpretation: Sinus rhythm No old tracing to compare Confirmed by Margarita Grizzle [phone]) on 08/23/2023 9:31:26 AM  Radiology CT Head  Wo Contrast Result Date: 08/23/2023 CLINICAL DATA:  Headache EXAM: CT HEAD WITHOUT CONTRAST TECHNIQUE: Contiguous axial images were obtained from the base of the skull through the vertex without intravenous contrast. RADIATION DOSE REDUCTION: This exam was performed according to the departmental dose-optimization program which includes automated exposure control, adjustment of the mA and/or kV according to patient size and/or use of iterative reconstruction technique. COMPARISON:  None Available. FINDINGS: Brain: No acute intracranial abnormality. Specifically, no hemorrhage, hydrocephalus, mass lesion, acute infarction, or significant intracranial injury. Vascular: No  hyperdense vessel or unexpected calcification. Skull: No acute calvarial abnormality. Sinuses/Orbits: No acute findings Other: None IMPRESSION: No acute intracranial abnormality. Electronically Signed   By: Charlett Nose M.D.   On: 08/23/2023 10:29   DG Chest 2 View Result Date: 08/23/2023 CLINICAL DATA:  Weakness. EXAM: CHEST - 2 VIEW COMPARISON:  None Available. FINDINGS: The lungs are clear. There is no pleural effusion or pneumothorax. The cardiac silhouette is within normal limits. No acute osseous pathology. Degenerative changes of the spine. IMPRESSION: No active cardiopulmonary disease. Electronically Signed   By: Elgie Collard M.D.   On: 08/23/2023 10:27    Procedures Procedures    Medications Ordered in ED Medications  lactated ringers bolus 1,000 mL (0 mLs Intravenous Stopped 08/23/23 1110)    ED Course/ Medical Decision Making/ A&P             HEART Score: 4                    Medical Decision Making  73 yo female ho high cholesterol, induced dvt, presents with chest burning, and lightheadedness.  Additionally she had some dull headache which she associates with ongoing neck pain and has been there was some chronicity.  She also had not had her usual caffeine intake today. DDX includes but not limited to: ACS, PE,  volume depletion, electrolyte abnormality, intracranial abnormality including bleeding, infection. 1-heartburn/chest pain-patient could have esophagitis, also has risk factors for CAD, does not appear to have any acute discomfort or tenderness in her upper abdomen or concerning symptoms for acute aortic syndrome.  EKG without acute st changes- Heart score moderate at 4.  Discussed results with patient and concerns.  She is advised to return if she has any return of chest discomfort.  She is agreeing given a referral to cardiology for follow-up. 2 lightheadedness # suspect that this is due to some volume depletion.  Patient was hydrated here in the ED has ambulated without difficulty 3 headache patient states improving after caffeine.  Head CT obtained without any evidence of acute abnormality          Final Clinical Impression(s) / ED Diagnoses Final diagnoses:  Lightheadedness  Chest pain, unspecified type    Rx / DC Orders ED Discharge Orders     None         Margarita Grizzle, MD 08/23/23 1139

## 2023-08-23 NOTE — Progress Notes (Signed)
   08/23/23 0925  Spiritual Encounters  Type of Visit Initial  Care provided to: Pt and family  Referral source Nurse (RN/NT/LPN)   Per referral from [place], RN in ICU, I came to ED to offer support to Mrs. Elira Colasanti who was in-house to visit her husband who is an inpatient.  I visited briefly with Mrs. Mcmorris and her daughter. They debriefed with me recent events that have developed and are stress factors.  I provided compassionate presence and active listening. I let them know of my ongoing support as needed and offered to follow up in Mr. Leiphart's room which will be 1537 later this morning.  Xzayvion Vaeth L. Sophronia Simas, M.Div [phone]

## 2023-08-23 NOTE — ED Triage Notes (Signed)
 Pt arrived reporting lightheadedness. Started while visiting husband this morning in ICU. States feelings like she is floating. Denies cp,shob or any other symptoms

## 2023-08-23 NOTE — Discharge Instructions (Addendum)
 Your evaluated here in the emergency department today for chest pain, lightheadedness, and headache. Your evaluated the head CT that showed no evidence of abnormality. Your urine has 0-5 red blood cells and a few white blood cells with epithelial cells as discussed.  You do not appear to be any symptoms consistent with urinary tract infection.  However, you should have your primary care doctor recheck for any red blood cells. Your heart tracing and heart enzymes are normal.  However, you do have some risk factors for heart disease.  Please call and follow-up with the heart doctors as soon as possible.  If you have any return of chest discomfort return immediately to the closest emergency department. My cell phone number is [phone], work number today until 4 pm is [phone] Drink plenty of fluids and eat regularly

## 2023-08-24 ENCOUNTER — Other Ambulatory Visit

## 2023-08-24 ENCOUNTER — Ambulatory Visit: Admitting: Neurology

## 2023-08-24 ENCOUNTER — Encounter: Payer: Self-pay | Admitting: Neurology

## 2023-08-24 VITALS — BP 128/76 | HR 91 | Ht 62.0 in | Wt 157.0 lb

## 2023-08-24 DIAGNOSIS — Z981 Arthrodesis status: Secondary | ICD-10-CM

## 2023-08-24 DIAGNOSIS — M5412 Radiculopathy, cervical region: Secondary | ICD-10-CM

## 2023-08-24 DIAGNOSIS — E611 Iron deficiency: Secondary | ICD-10-CM

## 2023-08-24 DIAGNOSIS — G2581 Restless legs syndrome: Secondary | ICD-10-CM

## 2023-08-24 DIAGNOSIS — R202 Paresthesia of skin: Secondary | ICD-10-CM

## 2023-08-24 DIAGNOSIS — R200 Anesthesia of skin: Secondary | ICD-10-CM

## 2023-08-24 MED ORDER — GABAPENTIN 300 MG PO CAPS: 300.0000 mg | ORAL_CAPSULE | Freq: Every day | ORAL | 11 refills | Status: AC

## 2023-08-24 NOTE — Patient Instructions (Signed)
 I saw you today for restless legs.  I would like to investigate further with lab work today.  I also want to make medication changes as your medication may be making your symptoms worse.  I will first start gabapentin 300 mg at bedtime.  1 week after starting gabapentin, reduce your pramipexole to 3.0 mg (2 tablets) in the evening. My goal will be to get you off this medication, but I am going to do it very slowly.  Stop levodopa-carbidopa.  Let me know how this goes for you.  I will see you back in clinic in 3 months to re-evaluate. Please let me know if you have any questions or concerns in the meantime.   The physicians and staff at [facility] are committed to providing excellent care. You may receive a survey requesting feedback about your experience at our office. We strive to receive "very good" responses to the survey questions. If you feel that your experience would prevent you from giving the office a "very good " response, please contact our office to try to remedy the situation. We may be reached at [phone]. Thank you for taking the time out of your busy day to complete the survey.  Jacquelyne Balint, MD [facility]

## 2023-08-27 ENCOUNTER — Emergency Department (HOSPITAL_COMMUNITY)
Admission: EM | Admit: 2023-08-27 | Discharge: 2023-08-27 | Disposition: A | Discharge status: Home / Self Care | Attending: Emergency Medicine | Admitting: Emergency Medicine

## 2023-08-27 ENCOUNTER — Encounter (HOSPITAL_COMMUNITY): Payer: Self-pay | Admitting: Emergency Medicine

## 2023-08-27 ENCOUNTER — Other Ambulatory Visit: Payer: Self-pay

## 2023-08-27 ENCOUNTER — Emergency Department (HOSPITAL_COMMUNITY)

## 2023-08-27 DIAGNOSIS — Z7982 Long term (current) use of aspirin: Secondary | ICD-10-CM | POA: Diagnosis not present

## 2023-08-27 DIAGNOSIS — R079 Chest pain, unspecified: Secondary | ICD-10-CM | POA: Insufficient documentation

## 2023-08-27 HISTORY — DX: Restless legs syndrome: G25.81

## 2023-08-27 LAB — CBG MONITORING, ED: Glucose-Capillary: 84 mg/dL (ref 70–99)

## 2023-08-27 LAB — BASIC METABOLIC PANEL WITH GFR
Anion gap: 6 (ref 5–15)
BUN: 21 mg/dL (ref 8–23)
CO2: 27 mmol/L (ref 22–32)
Calcium: 8.8 mg/dL — ABNORMAL LOW (ref 8.9–10.3)
Chloride: 102 mmol/L (ref 98–111)
Creatinine, Ser: 0.32 mg/dL — ABNORMAL LOW (ref 0.44–1.00)
GFR, Estimated: >60 mL/min (ref >60)
Glucose, Bld: 94 mg/dL (ref 70–99)
Potassium: 3.8 mmol/L (ref 3.5–5.1)
Sodium: 135 mmol/L (ref 135–145)

## 2023-08-27 LAB — CBC
HCT: 45.8 % (ref 36.0–46.0)
Hemoglobin: 14.4 g/dL (ref 12.0–15.0)
MCH: 28.9 pg (ref 26.0–34.0)
MCHC: 31.4 g/dL (ref 30.0–36.0)
MCV: 92 fL (ref 80.0–100.0)
Platelets: 227 10*3/uL (ref 150–400)
RBC: 4.98 MIL/uL (ref 3.87–5.11)
RDW: 13.1 % (ref 11.5–15.5)
WBC: 5.2 10*3/uL (ref 4.0–10.5)
nRBC: 0 % (ref 0.0–0.2)

## 2023-08-27 LAB — D-DIMER, QUANTITATIVE: D-Dimer, Quant: 0.5 ug{FEU}/mL (ref 0.00–0.50)

## 2023-08-27 LAB — TROPONIN I (HIGH SENSITIVITY)
Troponin I (High Sensitivity): 3 ng/L (ref <18)
Troponin I (High Sensitivity): 3 ng/L (ref <18)

## 2023-08-27 NOTE — ED Triage Notes (Addendum)
 Patient comes in with heartburn that started this am and tingling down arms, lightheaded, and shortness of breath yesterday. Dry mouth that has gotten worse. Husband passed away within the last week.

## 2023-08-27 NOTE — Discharge Instructions (Addendum)
 You were seen in the emergency department for your chest pain.  Your work-up showed no signs of heart attack, pneumonia, or fluid on your lungs.  It is unclear the cause of your chest pain at this time, but you can follow-up with your primary doctor to have your symptoms rechecked.  We have also given you a referral to cardiology to follow-up with.  They should be giving a call in the next few days to have your you do not hear from them by Tuesday or Wednesday would call their office to schedule follow-up appointment.  You should return to the emergency department if your pain significantly worsens, you have worsening shortness of breath, you pass out, or if you have any other new or concerning symptoms.

## 2023-08-27 NOTE — ED Provider Notes (Signed)
 [place] EMERGENCY DEPARTMENT AT [facility] Provider Note   CSN: [id number] Arrival date & time: 08/27/23  0846     History  Chief Complaint  Patient presents with   Heartburn    Bonnie Robinson is a 73 y.o. female.  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  Patient states that she woke up this morning with pressure type of pain in her mid chest.  She states that she had tingling sensation that went down her bilateral arms.  She states it lasted for about 15 minutes and resolved on its own.  She states that she has associated lightheadedness.  She states that she was feeling short of breath yesterday and had no worsening shortness of breath with the pain this morning.  Denies any diaphoresis, nausea or vomiting.  Denies any swelling in her legs.  Denies any known cardiac history.  Patient states that her last DVT was provoked after surgery, denies any recent surgeries or hospitalizations, recent long travel car or plane, hormone use or cancer history.  The history is provided by the patient and a relative.  Heartburn       Home Medications Prior to Admission medications   Medication Sig Start Date End Date Taking? Authorizing Provider  acetaminophen (TYLENOL) 650 MG CR tablet Take 1,300 mg by mouth every 8 (eight) hours as needed for pain.    [provider]  APIXABAN Everlene Balls) VTE STARTER PACK (10MG  AND 5MG ) Take as directed on package: start with two-5mg  tablets twice daily for 7 days. On day 8, switch to one-5mg  tablet twice daily. Patient not taking: Reported on 08/24/2023 09/06/21   Wynetta Fines, MD  aspirin 325 MG tablet Take 975 mg by mouth daily as needed for mild pain. Patient not taking: Reported on 08/24/2023    [provider]  CALCIUM-VITAMIN D PO Take 1 tablet by mouth in the morning and at bedtime. Patient not  taking: Reported on 08/24/2023    [provider]  carbidopa-levodopa (PARCOPA) 10-100 MG disintegrating tablet Take 2 tablets by mouth daily as needed (restless leg). 2 as needed 04/07/21   [provider]  cyclobenzaprine (FLEXERIL) 5 MG tablet Take 1 tablet (5 mg total) by mouth 3 (three) times daily as needed for muscle spasms. Patient not taking: Reported on 08/24/2023 08/29/21   Arman Bogus, MD  gabapentin (NEURONTIN) 300 MG capsule Take 1 capsule (300 mg total) by mouth at bedtime. 08/24/23   Antony Madura, MD  Menthol, Topical Analgesic, (ICY HOT BACK EX) Apply 1 application. topically 2 (two) times daily as needed (pain). Patient not taking: Reported on 08/24/2023    [provider]  Menthol-Methyl Salicylate (SALONPAS PAIN RELIEF PATCH EX) Apply 1 patch topically daily as needed (pain).    [provider]  Multiple Vitamin (MULTIVITAMIN) tablet Take 1 tablet by mouth daily. Patient not taking: Reported on 08/24/2023    [provider]  Multiple Vitamins-Minerals (PRESERVISION AREDS 2 PO) Take by mouth.    [provider]  Omega-3 Fatty Acids (FISH OIL PO) Take 1 capsule by mouth daily. Patient not taking: Reported on 08/24/2023    [provider]  omeprazole (PRILOSEC) 40 MG capsule Take 40 mg by mouth daily. As needed 07/05/21   [provider]  oxyCODONE (OXY IR/ROXICODONE) 5 MG immediate release tablet Take 1 tablet (5  mg total) by mouth every 4 (four) hours as needed for severe pain ((score 7 to 10)). Patient not taking: Reported on 08/24/2023 08/29/21   Joaquin Mulberry, MD  pramipexole (MIRAPEX) 1.5 MG tablet Take 4.5 mg by mouth every evening. 08/08/21   [provider]  rosuvastatin (CRESTOR) 5 MG tablet Take 5 mg by mouth daily. 06/22/21   [provider]  Zoledronic Acid (RECLAST IV) Inject 1 Dose into the vein See admin instructions. Once a year Patient not taking: Reported on 08/24/2023     [provider]      Allergies    Sulfa antibiotics    Review of Systems   Review of Systems  Gastrointestinal:  Positive for heartburn.    Physical Exam Updated Vital Signs BP 127/82   Pulse 64   Temp 97.8 F (36.6 C) (Oral)   Resp 19   Ht 5\' 2"  (1.575 m)   Wt 71 kg   SpO2 100%   BMI 28.63 kg/m  Physical Exam Vitals and nursing note reviewed.  Constitutional:      General: She is not in acute distress.    Appearance: Normal appearance.  HENT:     Head: Normocephalic and atraumatic.     Nose: Nose normal.     Mouth/Throat:     Mouth: Mucous membranes are moist.  Eyes:     Extraocular Movements: Extraocular movements intact.     Conjunctiva/sclera: Conjunctivae normal.  Cardiovascular:     Rate and Rhythm: Normal rate and regular rhythm.     Pulses: Normal pulses.     Heart sounds: Normal heart sounds.  Pulmonary:     Effort: Pulmonary effort is normal.     Breath sounds: Normal breath sounds.  Abdominal:     General: Abdomen is flat.     Palpations: Abdomen is soft.     Tenderness: There is no abdominal tenderness.  Musculoskeletal:        General: Normal range of motion.     Cervical back: Normal range of motion.     Right lower leg: No edema.     Left lower leg: No edema.     Comments: No chest wall tenderness to palpation  Skin:    General: Skin is warm and dry.  Neurological:     General: No focal deficit present.     Mental Status: She is alert and oriented to person, place, and time.     Sensory: No sensory deficit.     Motor: No weakness.  Psychiatric:        Mood and Affect: Mood normal.        Behavior: Behavior normal.     ED Results / Procedures / Treatments   Labs (all labs ordered are listed, but only abnormal results are displayed) Labs Reviewed  BASIC METABOLIC PANEL WITH GFR - Abnormal; Notable for the following components:      Result Value   Creatinine, Ser 0.32 (*)    Calcium 8.8 (*)    All other components within  normal limits  CBC  D-DIMER, QUANTITATIVE  CBG MONITORING, ED  TROPONIN I (HIGH SENSITIVITY)  TROPONIN I (HIGH SENSITIVITY)    EKG EKG Interpretation Date/Time:  Sunday August 27 2023 08:54:21 EDT Ventricular Rate:  77 PR Interval:  159 QRS Duration:  92 QT Interval:  378 QTC Calculation: 428 R Axis:   42  Text Interpretation: Sinus rhythm No significant change since last tracing Confirmed by Celesta Coke ([id number]) on 08/27/2023  9:40:41 AM  Radiology DG Chest 2 View Result Date: 08/27/2023 CLINICAL DATA:  Chest pain, heartburn EXAM: CHEST - 2 VIEW COMPARISON:  None Available. FINDINGS: Normal mediastinum and cardiac silhouette. Normal pulmonary vasculature. No evidence of effusion, infiltrate, or pneumothorax. No acute bony abnormality. Lumbar fusion IMPRESSION: No acute cardiopulmonary process. Electronically Signed   By: Deboraha Fallow M.D.   On: 08/27/2023 09:57    Procedures Procedures    Medications Ordered in ED Medications - No data to display  ED Course/ Medical Decision Making/ A&P Clinical Course as of 08/27/23 1419  Sun Aug 27, 2023  1236 D-dimer negative, initial troponin negative. Symptoms starting just prior to arrival and will have repeat. [VK]  1343 Repeat troponin negative. Unclear etiology of pain but has remained chest pain free. With recurrence of symptoms will be recommended cardiology follow up. [VK]    Clinical Course User Index [VK] Kingsley, Romario Tith K, DO                                 Medical Decision Making This patient presents to the ED with chief complaint(s) of chest pain with pertinent past medical history of prior DVT no longer on [facility], restless leg syndrome which further complicates the presenting complaint. The complaint involves an extensive differential diagnosis and also carries with it a high risk of complications and morbidity.    The differential diagnosis includes ACS, arrhythmia, anemia, pneumonia, pneumothorax, pulmonary  edema, pleural effusion, considering PE with prior VTE history, gastritis, GERD  Additional history obtained: Additional history obtained from family Records reviewed recent ED records and outpatient neurology records  ED Course and Reassessment: On patient's arrival she is hemodynamically stable in no acute distress.  EKG on arrival showed normal sinus rhythm without acute ischemic changes.  Patient will have labs including troponin as well as D-dimer and chest x-ray performed.  She is currently chest pain-free and will be closely monitored on telemetry.  Independent labs interpretation:  The following labs were independently interpreted: within normal range  Independent visualization of imaging: - I independently visualized the following imaging with scope of interpretation limited to determining acute life threatening conditions related to emergency care: CXR, which revealed no acute disease  Consultation: - Consulted or discussed management/test interpretation w/ external professional: N/A  Consideration for admission or further workup: Patient has no emergent conditions requiring admission or further work-up at this time and is stable for discharge home with primary care follow-up  Social Determinants of health: N/A    Amount and/or Complexity of Data Reviewed Labs: ordered. Radiology: ordered.          Final Clinical Impression(s) / ED Diagnoses Final diagnoses:  Nonspecific chest pain    Rx / DC Orders ED Discharge Orders          Ordered    Ambulatory referral to Cardiology       Comments: If you have not heard from the Cardiology office within the next 72 hours please call [phone].   08/27/23 1417              Nolberto Batty, DO 08/27/23 1419

## 2023-08-27 NOTE — ED Notes (Addendum)
 Pt reports feeling "pressure" in head, around ear/temporal lobe right side  EDP notified

## 2023-08-28 ENCOUNTER — Encounter: Payer: Self-pay | Admitting: Cardiology

## 2023-08-28 DIAGNOSIS — E7800 Pure hypercholesterolemia, unspecified: Secondary | ICD-10-CM | POA: Insufficient documentation

## 2023-08-28 DIAGNOSIS — G2581 Restless legs syndrome: Secondary | ICD-10-CM | POA: Insufficient documentation

## 2023-08-30 ENCOUNTER — Encounter: Payer: Self-pay | Admitting: Neurology

## 2023-08-30 LAB — IRON,TIBC AND FERRITIN PANEL
%SAT: 21 % (ref 16–45)
Ferritin: 103 ng/mL (ref 16–288)
Iron: 73 ug/dL (ref 45–160)
TIBC: 348 ug/dL (ref 250–450)

## 2023-08-30 LAB — IMMUNOFIXATION ELECTROPHORESIS
IgG (Immunoglobin G), Serum: 469 mg/dL — ABNORMAL LOW (ref 600–1540)
IgM, Serum: 15 mg/dL — ABNORMAL LOW (ref 50–300)
Immunoglobulin A: 121 mg/dL (ref 70–320)

## 2023-08-30 LAB — VITAMIN B1: Vitamin B1 (Thiamine): 9 nmol/L (ref 8–30)

## 2023-08-30 LAB — VITAMIN B12: Vitamin B-12: 463 pg/mL (ref 200–1100)

## 2023-09-04 ENCOUNTER — Ambulatory Visit: Admitting: Cardiology

## 2023-09-04 DIAGNOSIS — G2581 Restless legs syndrome: Secondary | ICD-10-CM

## 2023-09-04 DIAGNOSIS — E7800 Pure hypercholesterolemia, unspecified: Secondary | ICD-10-CM

## 2023-09-26 ENCOUNTER — Other Ambulatory Visit: Payer: Self-pay | Admitting: Internal Medicine

## 2023-09-26 DIAGNOSIS — Z1231 Encounter for screening mammogram for malignant neoplasm of breast: Secondary | ICD-10-CM

## 2023-10-04 ENCOUNTER — Ambulatory Visit
Admission: RE | Admit: 2023-10-04 | Discharge: 2023-10-04 | Disposition: A | Discharge status: Home / Self Care | Source: Ambulatory Visit

## 2023-10-04 DIAGNOSIS — Z1231 Encounter for screening mammogram for malignant neoplasm of breast: Secondary | ICD-10-CM

## 2023-10-12 ENCOUNTER — Ambulatory Visit: Payer: PPO | Admitting: Dermatology

## 2023-11-29 ENCOUNTER — Ambulatory Visit: Admitting: Dermatology

## 2023-11-29 ENCOUNTER — Encounter: Payer: Self-pay | Admitting: Dermatology

## 2023-11-29 VITALS — BP 111/77 | HR 74

## 2023-11-29 DIAGNOSIS — L814 Other melanin hyperpigmentation: Secondary | ICD-10-CM

## 2023-11-29 DIAGNOSIS — D229 Melanocytic nevi, unspecified: Secondary | ICD-10-CM | POA: Diagnosis not present

## 2023-11-29 DIAGNOSIS — Z1283 Encounter for screening for malignant neoplasm of skin: Secondary | ICD-10-CM | POA: Diagnosis not present

## 2023-11-29 DIAGNOSIS — L821 Other seborrheic keratosis: Secondary | ICD-10-CM | POA: Diagnosis not present

## 2023-11-29 DIAGNOSIS — W908XXA Exposure to other nonionizing radiation, initial encounter: Secondary | ICD-10-CM

## 2023-11-29 DIAGNOSIS — R2243 Localized swelling, mass and lump, lower limb, bilateral: Secondary | ICD-10-CM | POA: Insufficient documentation

## 2023-11-29 DIAGNOSIS — L578 Other skin changes due to chronic exposure to nonionizing radiation: Secondary | ICD-10-CM

## 2023-11-29 DIAGNOSIS — D1801 Hemangioma of skin and subcutaneous tissue: Secondary | ICD-10-CM

## 2023-11-29 NOTE — Patient Instructions (Addendum)

## 2023-11-29 NOTE — Progress Notes (Signed)
   Total Body Skin Exam (TBSE) Visit   Subjective  Bonnie Robinson is a 73 y.o. female who presents for the following: Skin Cancer Screening and Full Body Skin Exam  Patient presents today for follow up visit for TBSE. Patient was last evaluated on 10/12/22 . Patient denies medication changes. Patient reports she does not have spots, moles and lesions of concern to be evaluated. Patient reports throughout her lifetime she has had severe sun exposure. Currently, patient reports if she has excessive sun exposure, she does not apply sunscreen and/or wears protective coverings. Patient reports she does not have hx of bx. Patient reports  family history of skin cancers. The patient has spots, moles and lesions to be evaluated, some may be new or changing and the patient has concerns that these could be cancer.  The following portions of the chart were reviewed this encounter and updated as appropriate: medications, allergies, medical history  Review of Systems:  No other skin or systemic complaints except as noted in HPI or Assessment and Plan.  Objective  Well appearing patient in no apparent distress; mood and affect are within normal limits.  A full examination was performed including scalp, head, eyes, ears, nose, lips, neck, chest, axillae, abdomen, back, buttocks, bilateral upper extremities, bilateral lower extremities, hands, feet, fingers, toes, fingernails, and toenails. All findings within normal limits unless otherwise noted below.   Relevant physical exam findings are noted in the Assessment and Plan.    Assessment & Plan   LENTIGINES, SEBORRHEIC KERATOSES, HEMANGIOMAS - Benign normal skin lesions - Benign-appearing - Call for any changes  MELANOCYTIC NEVI - Tan-brown and/or pink-flesh-colored symmetric macules and papules - Benign appearing on exam today - Observation - Call clinic for new or changing moles - Recommend daily use of broad spectrum spf 30+ sunscreen to sun-exposed  areas.   Moderate ACTINIC DAMAGE - Chronic condition, secondary to cumulative UV/sun exposure - diffuse scaly erythematous macules with underlying dyspigmentation - Recommend daily broad spectrum sunscreen SPF 30+ to sun-exposed areas, reapply every 2 hours as needed.  - Staying in the shade or wearing long sleeves, sun glasses (UVA+UVB protection) and wide brim hats (4-inch brim around the entire circumference of the hat) are also recommended for sun protection.  - Call for new or changing lesions.  SKIN CANCER SCREENING PERFORMED TODAY.    Return in about 1 year (around 11/28/2024) for TBSE.  I, Jetta Ager, am acting as Neurosurgeon for [employer], DO.  Documentation: I have reviewed the above documentation for accuracy and completeness, and I agree with the above.  Delon Lenis, DO

## 2023-12-14 NOTE — Progress Notes (Signed)
 NEUROLOGY FOLLOW UP OFFICE NOTE  Bonnie Robinson [id number]  Subjective:  Bonnie Robinson is a 73 y.o. year old right-handed female with a medical history of HLD, GERD, lumbar spine surgery (08/2021), DVT who we last saw on 08/24/23 for restless leg syndrome.  To briefly review: 08/24/23: Patient's symptoms started when she was 73 years old. She describes a restless sensation, mostly in her legs. Moving helps or concentrating on something helps the most. She does crafts all the time to help with symptoms. Symptoms start in the afternoon. If she starts just sitting, symptoms will prevent her for sitting still. Her mother had RLS and would occasionally give her Sinemet. That would help.   She was prescribed pramipexole  in the 1990s. It started at a low dose and is currently 4.5 mg daily, having been increased over the years. She had a sleep study back then. She only slept a few hours and was able to go home in the middle of the night due to clear results. Patient also has been taking levodopa-carbidopa 10-100 mg for many years.   Patient takes pramipexole  4.5 mg at 5 pm in the evening. She used to take this later in the past.    Patient will take levodopa-carbidopa only when needed, maybe 4-5 times per month.   She takes iron (natural beet iron).   Patient is overall doing okay currently, but can have bad nights. She denies tremors, freezing, imbalance, or falls. She will occasionally have abnormal movement of head which she attributes to her neck.   Patient is needing to take the medication at higher and higher doses and earlier at night. She does not think it is spreading to new parts of her body. It is only rarely in her arms, but been like this since she was 21.   She endorses poor sleep. She goes to bed around 11p-12a. She will get up at 3-5am. She endorses limb movements in her sleep. She may snore occasionally. She thinks she is well rested when she wakes up. She is not tired throughout the  day. She does drink a lot of caffeine though.   Patient was asking PCP about a sleep specialist. She is going to see someone in [place] who told her that he could cure it.   Patient has been on gabapentin  in the past due to pain in back and legs. She does not know if this changes RLS symptoms. She does not remember any side effects.   She has numbness and tingling in her legs, which she states started last year (about 1 year ago). She also has tingling in her neck (due to cervical spine issues per patient).   She does not report any constitutional symptoms like fever, night sweats, anorexia or unintentional weight loss.   EtOH use: 2-3 glasses of wine once a week  Restrictive diet? Intermittent fasting, but good variety Family history of neuropathy/myopathy/neurologic disease? Mother with RLS, other daughter and son have RLS   She may have had an EMG in the past (sound familiar to her) but is not sure and would not know the results.  Most recent Assessment and Plan (08/24/23): Bonnie Robinson is a 73 y.o. female who presents for evaluation of restless legs. She has a relevant medical history of HLD, GERD, lumbar spine surgery (08/2021), DVT. Her neurological examination is essentially normal. Available diagnostic data is significant for ferritin in 2024 of 44. MRI cervical spine shows chronic spinal stenosis and neural foraminal stenosis.  MRI lumbar spine similarly shows multilevel stenosis. Patient's symptoms are consistent with restless leg syndrome. She may also have periodic limb movement disorder of sleep. Her symptoms are very long standing. She has been on levodopa-carbidopa and pramipexole  for decades. She has needed higher doses and earlier dosing as symptoms continued to worsen. This is likely due to augmentation. I explained that the new guidelines recommend staying away from dopaminergic medications and instead prefer gabapentin  or Lyrica. She may also have abnormal movements from too much  dopamine.   PLAN: -Blood work: Iron studies, B1, B12, IFE -Start gabapentin  300 mg at bedtime -Reduce pramipexole  to 3.0 mg in evening in 1 week -Stop levodopa-carbidopa  Since their last visit: Labs, including ferritin were normal.   Patient feels like her RLS is a bit better. She has cut her pramipexole  to 1 tablet of 1.5 mg in the evening over the last week. Her sleep doctor increased her gabapentin  to 600 mg at bedtime. She had a sleep study that showed no significant abnormalities. She has only taken Sinemet 5-6 times since I last saw her.  She still has tingling in her feet.  MEDICATIONS:  Outpatient Encounter Medications as of 12/22/2023  Medication Sig   acetaminophen  (TYLENOL ) 650 MG CR tablet Take 1,300 mg by mouth every 8 (eight) hours as needed for pain.   aspirin  325 MG tablet Take 975 mg by mouth daily as needed for mild pain.   CALCIUM-VITAMIN D PO Take 1 tablet by mouth in the morning and at bedtime.   carbidopa-levodopa (PARCOPA) 10-100 MG disintegrating tablet Take 2 tablets by mouth daily as needed (restless leg). 2 as needed   gabapentin  (NEURONTIN ) 300 MG capsule Take 1 capsule (300 mg total) by mouth at bedtime.   meloxicam (MOBIC) 15 MG tablet Take 15 mg by mouth daily.   Menthol , Topical Analgesic, (ICY HOT BACK EX) Apply 1 application. topically 2 (two) times daily as needed (pain).   Menthol -Methyl Salicylate (SALONPAS PAIN RELIEF PATCH EX) Apply 1 patch topically daily as needed (pain).   Multiple Vitamin (MULTIVITAMIN) tablet Take 1 tablet by mouth daily.   Multiple Vitamins-Minerals (PRESERVISION AREDS 2 PO) Take by mouth.   omeprazole (PRILOSEC) 40 MG capsule Take 40 mg by mouth daily. As needed   pramipexole  (MIRAPEX ) 1.5 MG tablet Take 4.5 mg by mouth every evening.   rosuvastatin (CRESTOR) 5 MG tablet Take 5 mg by mouth daily.   Zoledronic  Acid (RECLAST  IV) Inject 1 Dose into the vein See admin instructions. Once a year   APIXABAN  (ELIQUIS ) VTE STARTER  PACK (10MG  AND 5MG ) Take as directed on package: start with two-5mg  tablets twice daily for 7 days. On day 8, switch to one-5mg  tablet twice daily. (Patient not taking: Reported on 11/29/2023)   cyclobenzaprine  (FLEXERIL ) 5 MG tablet Take 1 tablet (5 mg total) by mouth 3 (three) times daily as needed for muscle spasms. (Patient not taking: Reported on 11/29/2023)   Omega-3 Fatty Acids (FISH OIL PO) Take 1 capsule by mouth daily.   oxyCODONE  (OXY IR/ROXICODONE ) 5 MG immediate release tablet Take 1 tablet (5 mg total) by mouth every 4 (four) hours as needed for severe pain ((score 7 to 10)). (Patient not taking: Reported on 11/29/2023)   No facility-administered encounter medications on file as of 12/22/2023.    PAST MEDICAL HISTORY: Past Medical History:  Diagnosis Date   Acute thromboembolism of deep veins of left lower extremity (HCC) 09/17/2021   Age-related osteoporosis without current pathological fracture 02/03/2021  Arthropathy of cervical facet joint 12/17/2020   Added automatically from request for surgery [id number]     Bilateral occipital neuralgia 11/02/2020   BRCA negative 02/03/2020   Erosive (osteo)arthritis 11/02/2020   Family history of ovarian cancer 02/03/2020   High cholesterol    Hx of iron deficiency 04/29/2020   Neck pain 05/05/2020   Restless leg syndrome    Right arm weakness 02/03/2021   S/P lumbar fusion 08/27/2021   Spondylolisthesis at L4-L5 level 08/23/2021   Spondylosis without myelopathy or radiculopathy, cervical region 02/03/2021   Vitamin B12 deficiency 07/07/2019    PAST SURGICAL HISTORY: Past Surgical History:  Procedure Laterality Date   NECK EXPLORATION      ALLERGIES: Allergies  Allergen Reactions   Sulfa Antibiotics Other (See Comments)    Childhood allergy    FAMILY HISTORY: Family History  Problem Relation Age of Onset   Cancer Mother    Heart failure Father     SOCIAL HISTORY: Social History   Tobacco Use   Smoking status:  Never    Passive exposure: Never   Smokeless tobacco: Never  Vaping Use   Vaping status: Never Used  Substance Use Topics   Alcohol use: Yes    Comment: occasionally   Drug use: Never   Social History   Social History Narrative   Are you right handed or left handed? Right   Are you currently employed ?    What is your current occupation? retired   Do you live at home alone? yes   Who lives with you?    What type of home do you live in: 1 story or 2 story? two    Caffiene 5 cups      Objective:  Vital Signs:  BP 132/80 (BP Location: Left Arm, Patient Position: Sitting)   Pulse 86   Ht 5' 1 (1.549 m)   Wt 160 lb (72.6 kg)   SpO2 97%   BMI 30.23 kg/m   General: No acute distress.  Patient appears well-groomed.   Head:  Normocephalic/atraumatic Neck: supple Heart: regular rate and rhythm Lungs: Clear to auscultation bilaterally. Vascular: No carotid bruits.  Neurological Exam: Mental status: alert and oriented, speech fluent and not dysarthric, language intact.  Cranial nerves: CN I: not tested CN II: pupils equal, round and reactive to light, visual fields intact CN III, IV, VI:  full range of motion, no nystagmus, no ptosis CN V: facial sensation intact. CN VII: upper and lower face symmetric CN VIII: hearing intact CN IX, X: uvula midline CN XI: sternocleidomastoid and trapezius muscles intact CN XII: tongue midline  Bulk & Tone: normal, no fasciculations. Motor:  muscle strength 5/5 throughout Deep Tendon Reflexes:  2+ throughout, except 1+ at ankles.   Sensation:  Vibratory sensation intact. Finger to nose testing:  Without dysmetria.   Gait:  Normal station and stride.   Labs and Imaging review: New results: External labs, 12/14/23: Vit D wnl CMP unremarkable  08/24/23: B12: 463 B1 wnl IFE: no M protein Iron studies: ferritin 103  Previously reviewed results: External labs: 07/06/22: CBC w/ diff unremarkable CMP unremarkable TSH  wnl Lipid panel: tChol 246, LDL 131, TG 66 Ferritin 44   HbA1c (06/30/21): 5.5 Vit D (02/23/21) wnl B12 (01/07/20): 357   MRI thoracic spine wo contrast (08/28/21): IMPRESSION: 1. Exaggerated thoracic kyphosis with mild multilevel upper thoracic spondylolisthesis T2-T3 through T4-T5. Associated disc and posterior element degeneration at those levels with borderline to mild spinal stenosis at the latter.  2. Capacious thoracic spinal canal elsewhere. Normal thoracic spinal cord.   3. Lower thoracic facet hypertrophy results in moderate to severe left greater than right T10 neural foraminal stenosis.   MRI cervical spine wo contrast (08/25/21): IMPRESSION: 1. Up to moderate degenerative spinal stenosis and spinal cord mass effect at C4-C5 does not appear significantly changed from the MRI last year, with cord compression but no definite abnormal cord signal. Underlying interbody ankylosis there but endplate spurring and posterior ligamentous hypertrophy contribute to stenosis.   2. Advanced cervical spine degeneration elsewhere, with mild progression of chronic multifactorial spinal stenosis and spinal cord mass at C3-C4, C5-C6. Chronic moderate or severe bilateral C4, right C6 neural foraminal stenosis.   3. Chronic severe C1-C2 joint space loss on the left with ongoing associated marrow edema.   4. 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   MRI lumbar spine wo contrast (08/23/21): IMPRESSION: 1. Grade 1 anterolisthesis of L4 on L5 with associated advanced facet arthropathy and disc bulge resulting in severe spinal canal stenosis with impingement of the cauda equina nerve roots and mild-to-moderate left and mild right neural foraminal stenosis. 2. Subarticular zone narrowing on the right at L1-L2 and L2-L3, bilaterally at L3-L4 (left worse than right), and on the left at L5-S1 with potential irritation of the traversing nerve roots. There is  also mild-to-moderate left neural foraminal stenosis at L5-S1. 3. Levoscoliosis centered at L3.  Assessment/Plan:  This is Bonnie Robinson, a 73 y.o. female with restless leg syndrome c/b augmentation from pramipexole  and Sinemet. She has tingling in her legs and arms, likely 2/2 to known spine disease. Overall, she is improved from prior having weaned down pramipexole  and Sinemet and increasing gabapentin .   Plan: -Continue gabapentin  600 mg at bedtime -Continue pramipexole  1.5 mg at bedtime. Cut pramipexole  0.75 mg (1/2 tablet) in about 3 months. -Try not to use Sinemet if able -Will speak to sleep medicine about her insomnia  Return to clinic in 6 months  Venetia Potters, MD

## 2023-12-22 ENCOUNTER — Ambulatory Visit: Admitting: Neurology

## 2023-12-22 ENCOUNTER — Encounter: Payer: Self-pay | Admitting: Neurology

## 2023-12-22 VITALS — BP 132/80 | HR 86 | Ht 61.0 in | Wt 160.0 lb

## 2023-12-22 DIAGNOSIS — R200 Anesthesia of skin: Secondary | ICD-10-CM | POA: Diagnosis not present

## 2023-12-22 DIAGNOSIS — M5412 Radiculopathy, cervical region: Secondary | ICD-10-CM

## 2023-12-22 DIAGNOSIS — G2581 Restless legs syndrome: Secondary | ICD-10-CM | POA: Diagnosis not present

## 2023-12-22 DIAGNOSIS — Z981 Arthrodesis status: Secondary | ICD-10-CM | POA: Diagnosis not present

## 2023-12-22 DIAGNOSIS — R202 Paresthesia of skin: Secondary | ICD-10-CM

## 2023-12-22 NOTE — Patient Instructions (Signed)
-  Continue gabapentin  600 mg at bedtime -Continue pramipexole  1.5 mg at bedtime. Cut pramipexole  0.75 mg (1/2 tablet) in about 3 months. -Try not to use Sinemet if able -Will speak to sleep medicine about her insomnia  Return to clinic in 6 months  The physicians and staff at [facility] are committed to providing excellent care. You may receive a survey requesting feedback about your experience at our office. We strive to receive very good responses to the survey questions. If you feel that your experience would prevent you from giving the office a very good  response, please contact our office to try to remedy the situation. We may be reached at [phone]. Thank you for taking the time out of your busy day to complete the survey.  Venetia Potters, MD [facility]

## 2024-02-26 NOTE — Discharge Summary (Signed)
 Orthopedic Surgery Discharge Summary  Patient ID: Bonnie Robinson [id number] 72 y.o. 1950-10-21  Admit date: 02/26/2024 Admitting Physician: Vinie Carlin Fonder, MD Admission Diagnoses:  Primary osteoarthritis of left hip [M16.12] Hip osteoarthritis [M16.9]    Discharge date and time: 02/26/2024   Discharge Physician: Vinie Carlin Fonder, * Discharge Diagnoses:  Principal Problem:   Primary osteoarthritis of left hip Active Problems:   Hip osteoarthritis Resolved Problems:   * No resolved hospital problems. *  Operative Procedures: Procedure(s): ARTHROPLASTY HIP TOTAL ANTERIOR, LEFT  Indications for Operative Procedures: 73 y.o. female with end-stage severe degenerative arthritis.  Failed conservative treatment.  Risks benefits and options to the above procedure were discussed with the patient and patient wished to proceed.  Hospital Course:   73 y.o. female presented to the OR on the above named dates for the above named procedures.  Patient tolerated the procedure without difficulty or complication and was transferred to the floor for postoperative management, prophylactic antibiotics, pain control, therapy, and discharge planning.  The patient's pain was well controlled with the prescribed pain regimen.  On the day of discharge, the patient is verbalizing adequate relief with the use of the prescribed pain regimen.  The patient has been able to void without difficulty.  The patient has been able to tolerate their diet.  The patient is with an aquacel dressing to their operative extremity that is clean, dry, and intact.  Therapy recommended home care which was coordinated by nursing case manager. DVT prophylaxis initiated with early ambulation, TED hose, SCDs, and ASA. The patient is verbalizing the desire to be discharged.  Discharged to home. Bilateral TED Hose and ASA 325 mg BID for 3 weeks for DVT prophylaxis along with pain medicine to pt pharmacy of preference.  At the  time of discharge, the patient was afebrile, vital signs were stable, in no acute distress. Denies SOB, chest pain, chest discomfort, light headedness, dizziness, fevers, chills, nausea, vomiting, leg or calf pain. The patient meets all goals necessary for discharge from a medical, surgical, and therapy standpoint, and thus the patient is stable and safe for discharge.  New discharge medications discussed in detail and the patient stated understanding of use and administration.  The patient is verbalizing understanding of all discharge instructions and therefore was released.     Discharge exam: -General:  No acute distress.  Well nourished.  Appears to be stated age. -HEENT:  Normocephalic, atraumatic. -Respiratory:  Normal respiratory effort.  No wheezing.  No shortness of breath. -Heme / Lymph:  No lymphadenopathy noted in the lower extremities. -Psych:  Alert and Oriented X 4.  Normal affect. -Cardio/Vascular:  Extremities warm and well perfused with brisk cap refill. -Skin:  No ulcerations or rashes - Ortho: Operative extremity neurovascularly intact.  Dressing clean dry intact.  Aquacel with bleeding which does not reach boarder. Negative homans sign with soft compartments and non-tender. Motor Strength: + motor EHL (great toe dorsiflexion) Bilateral grade 5 - 100% normal (N) complete motion against gravity and full resistance + FHL (great toe plantar flexion) Bilateral grade 5 - 100% normal (N) complete motion against gravity and full resistance + TA (ankle dorsiflexion) Bilateral grade 5 - 100% normal (N) complete motion against gravity and full resistance + GSC (ankle plantar flexion) Bilateral grade 5 - 100% normal (N) complete motion against gravity and full resistance Neuro:  Sensation is intact to light touch in:  Superficial peroneal nerve distribution (over dorsum of foot) Bilaterally: WNL/No deficits noted Deep peroneal nerve  distribution (over first dorsal web space) Bilaterally:  WNL/No deficits noted Sural nerve distribution (posterior to lateral malleolus) Bilaterally: WNL/No deficits noted Saphenous nerve distribution (over medial malleolus) Bilaterally: WNL/No deficits noted  Surgical operations performed during hospitalization: Procedure(s) (LRB): ARTHROPLASTY HIP TOTAL ANTERIOR (Left)   Disposition: Home or Self Care  Patient Instructions:    Medication List     PAUSE taking these medications    calcium carbonate-vitamin D3 250 mg-3.125 mcg (125 unit) Tab per tablet Wait to take this until your doctor or other care provider tells you to start again. Commonly known as: OYSTER SHELL Take 1 tablet by mouth 2 (two) times a day.       START taking these medications    oxyCODONE -acetaminophen  5-325 mg per tablet Commonly known as: PERCOCET Take 1 tablet by mouth every 4 (four) hours as needed for moderate or severe pain (Take 1 tablet by mouth every 4 (four) hours as needed for moderate pain (4-6) or severe pain (7-10).)   Stimulant Laxative Plus 8.6-50 mg per tablet Generic drug: sennosides-docusate sodium Take 2 tablets by mouth 2 (two) times a day as needed for constipation.   Vitamin D2 1,250 mcg (50,000 unit) capsule Generic drug: ergocalciferol Take 1 capsule (50,000 Units total) by mouth once a week for 6 doses.       CHANGE how you take these medications    aspirin  325 mg tablet Take 1 tablet (325 mg total) by mouth 2 (two) times a day for 21 days. What changed: when to take this   carbidopa-levodopa 10-100 mg Tbdl DISSOLVE 2 TABLETS ON THE TONGUE DAILY AS NEEDED What changed: See the new instructions.   pramipexole  1.5 mg tablet Commonly known as: MIRAPEX  Take 1 to 3 tablets by mouth every night. Keep next appointment for further refills. What changed:  how much to take how to take this when to take this   rosuvastatin 5 mg tablet Commonly known as: CRESTOR Take 1 tablet (5 mg total) by mouth daily. MUST SCHEDULE  APPOINTMENT FOR FURTHER REFILLS. What changed: when to take this       CONTINUE taking these medications    acetaminophen  650 mg ER tablet Commonly known as: TYLENOL  Take 1,300 mg by mouth 3 (three) times a day as needed.   gabapentin  300 mg capsule Commonly known as: NEURONTIN  Take 600 mg by mouth every evening.   meloxicam 15 mg tablet Commonly known as: MOBIC Take 1 tablet (15 mg total) by mouth daily. May start the day after finishing prednisone  taper.   omeprazole 40 mg DR capsule Commonly known as: PriLOSEC Take 40 mg by mouth daily as needed (GERD).   vitamins A,C,E-zinc -copper 2,148 mcg-113 mg-45 mg-17.4mg  Tab Commonly known as: I-VITE PROTECT Take 1 tablet by mouth daily. On hold for surgery 02/26/24   zoledronic  acid-mannitoL-water 5 mg/100 mL Pgbk Commonly known as: RECLAST  Infuse 5 mg into a venous catheter once Indications: osteoporosis in postmenopausal woman at high risk for fracture. Last dose 11/2023 (yearly)         Where to Get Your Medications     These medications were sent to [facility]  [address], [place] [address]    Hours: Mon-Fri 8:30am-5pm; Sat-Sun: Closed; Holidays: Closed Phone: [phone]  aspirin  325 mg tablet oxyCODONE -acetaminophen  5-325 mg per tablet Stimulant Laxative Plus 8.6-50 mg per tablet Vitamin D2 1,250 mcg (50,000 unit) capsule     Activity: activity as tolerated and no driving for today, ambulate  in house, no heavy lifting, pushing, pulling with the implant side for 2 months, and no driving while on analgesics Diet: regular diet and as previously tolerated Wound Care: keep wound clean and dry, reinforce dressing PRN, ice to area for comfort, and as directed Remove dressing POD 7-10 and If no drainage noted, this may be left open to air Weight Bearing Status: WBAT:  left  Lower Extremity With Assistive Devices  Please contact your doctor or seek medical assistance if you experience  any of the following:  1. Fever greater than 101.5 F. 2. Decrease in urinary output. 3. Increased warmth, swelling, redness, or pain at your incision/wound site. 4. Increased drainage or bad odor at your incision/wound site.  5. Nausea/vomiting that does not stop.  6. Numbness, tingling, or discoloration of extremity.  7. Unable to drink fluids.  8. Uncontrollable pain.  9. Any other concerning symptoms.   Please, call 911 or go to your nearest emergency room if you experience any of the following:  1. Change in speech, vision, or ability to walk 2. Chest pain.  3. Difficulty breathing or shortness of breath.  Follow-up:  Future Appointments  Date Time Provider Department Center  02/29/2024  8:45 AM Leonce Crayton Borrow, PT [facility] PT PRE [facility]  03/01/2024 11:20 AM [facility] NURSE [facility]  03/04/2024  8:30 AM Megan Rosaline Ross, PTA [facility] PT PRE [facility]  03/07/2024  8:45 AM Megan Rosaline Ross, PTA [facility] PT PRE [facility]  03/11/2024  9:15 AM Leonce Crayton Borrow, PT [facility] PT PRE [facility]  03/13/2024  1:00 PM Brandy Danielle Cardwell, ATC [facility] [place]  03/14/2024  9:30 AM Leonce Crayton Borrow, PT [facility] PT PRE [facility]  03/18/2024  8:30 AM Leonce Crayton Borrow, PT [facility] PT PRE [facility]  03/21/2024 11:00 AM Leonce Crayton Borrow, PT [facility] PT PRE [facility]  03/25/2024  8:30 AM Leonce Crayton Borrow, PT [facility] PT PRE [facility]  03/28/2024 11:00 AM Leonce Crayton Borrow, PT [facility] PT PRE [facility]  04/08/2024  2:10 PM Vinie Carlin Fonder, MD [facility] SM [facility] [place]  10/25/2024  8:20 AM Pecola LITTIE Colletta DEVONNA [facility] [address]  11/05/2024  9:30 AM Norleen Harriett Crease, MD [facility] ORT GEN [facility]  01/17/2025 10:00 AM Ozell Lemond Sar, MD [facility] [address]    Norleen Lorrene Drop, [place] Date: 02/26/2024 Time: 2:42 PM

## 2024-02-29 ENCOUNTER — Ambulatory Visit: Admitting: Cardiology

## 2024-02-29 ENCOUNTER — Other Ambulatory Visit: Payer: Self-pay | Admitting: Medical Genetics

## 2024-02-29 DIAGNOSIS — Z006 Encounter for examination for normal comparison and control in clinical research program: Secondary | ICD-10-CM

## 2024-03-13 NOTE — Progress Notes (Signed)
 [facility] [place]              03/13/2024      Post Op NOTE   Patient Name: Bonnie Robinson   Gender:  female  MRN: [id number]      Date of Birth: 12/02/1950   OR PROCEDURE: Left direct anterior total Hip Arthroplasty on 02/26/2024   ASSESSMENT/PLAN: Left hip doing extremely well approximately 2 weeks status post the above procedure.  Wound inspected with no signs of infection.  Discussed wound care.  X-rays obtained and viewed by Dr. Lewanda.  She demonstrates good hip flexion and 90 degrees.  Continue aspirin  and TED hose for 1 more week.  Continue ice and elevation as needed.  She has been discharged from physical therapy but I did recommend that she continue her home exercise program.  Plan to follow-up with Dr. Lewanda in about 4 weeks to reassess her strength and range of motion.  Call for problems.    HPI:  Bonnie Robinson is a 73 y.o. female who presents s/p the above.  She states she feels great.  She states she has not been using any ambulatory aid.  She states that therapy discharged her on Monday this week.  She states she is very pleased with her progress.  She does have pain that goes down both legs bilaterally.  This almost seems like she is having some back issues which she does have a previous back surgery in her history.  We will continue to monitor this over the next few weeks and reassess when she comes back to see Dr. Lewanda.  No interval injuries.  No other concerns.   Vital Signs:  There were no vitals taken for this visit.   There is no height or weight on file to calculate BMI.   Physical Exam General: Well-developed, well-nourished, no acute distress Lungs: respirations unlabored Neuro: Alert and oriented 3 Extremities: Left hip incision is well-healed without induration or erythema suggestive of infection.  Negative Homans sign.  Cap refill less than 2 seconds.  Normal sensation to light touch is intact.  Moderate swelling  and bruising present as expected.  She does have a moderate-sized hematoma localized to the incision site. Skin: Warm and dry  Psych: mood and affect normal     Radiology: @IMGRESULTTODAYONLY @     ALLERGIES Allergies[1]  MEDICATIONS  Medications Prior to Visit[2]  Past Medical History Medical History[3]  Past Surgical History Surgical History[4]  Family History  Family History[5]  Social History Social History   Socioeconomic History  . Marital status: Widowed    Spouse name: Not on file  . Number of children: Not on file  . Years of education: Not on file  . Highest education level: Not on file  Occupational History  . Not on file  Tobacco Use  . Smoking status: Former    Current packs/day: 0.00    Types: Cigarettes    Quit date: 05/16/1972    Years since quitting: 51.8  . Smokeless tobacco: Never  Vaping Use  . Vaping status: Never Used  Substance and Sexual Activity  . Alcohol use: Yes    Alcohol/week: 2.0 - 3.0 standard drinks of alcohol    Types: 2 - 3 Drinks containing 0.5 oz of alcohol per week    Comment: occ  . Drug use: Never  . Sexual activity: Not Currently  Other Topics Concern  . Not on file  Social History Narrative  .  Not on file   Social Drivers of Health   Food Insecurity: Low Risk  (12/08/2023)   Food vital sign   . Within the past 12 months, you worried that your food would run out before you got money to buy more: Never true   . Within the past 12 months, the food you bought just didn't last and you didn't have money to get more: Never true  Transportation Needs: No Transportation Needs (12/08/2023)   Transportation   . In the past 12 months, has lack of reliable transportation kept you from medical appointments, meetings, work or from getting things needed for daily living? : No  Safety: Low Risk  (10/24/2023)   Safety   . How often does anyone, including family and friends, physically hurt you?: Never   . How often does anyone,  including family and friends, insult or talk down to you?: Never   . How often does anyone, including family and friends, threaten you with harm?: Never   . How often does anyone, including family and friends, scream or curse at you?: Never  Living Situation: Low Risk  (12/08/2023)   Living Situation   . What is your living situation today?: I have a steady place to live   . Think about the place you live. Do you have problems with any of the following? Choose all that apply:: None/None on this list       Leotis Gambler, LAT, ATC [facility] - [place] 03/13/2024 1:09 PM           [1] Allergies Allergen Reactions  . Sulfamethoxazole Other (See Comments)    Childhood allergy   [2] Outpatient Medications Prior to Visit  Medication Sig Dispense Refill  . acetaminophen  (TYLENOL ) 650 mg ER tablet Take 1,300 mg by mouth 3 (three) times a day as needed.    . aspirin  325 mg tablet Take 1 tablet (325 mg total) by mouth 2 (two) times a day for 21 days. 42 tablet 0  . calcium carbonate-vitamin D3 (OYSTER SHELL) 250 mg-3.125 mcg (125 unit) tab per tablet Take 1 tablet by mouth 2 (two) times a day.    . carbidopa-levodopa 10-100 mg TbDL DISSOLVE 2 TABLETS ON THE TONGUE DAILY AS NEEDED (Patient taking differently: Dissolve on tongue as needed (weaning off for restless legs) Indications: restless legs syndrome, an extreme discomfort in the calf muscles when sitting or lying down.) 180 tablet 0  . ergocalciferol (VITAMIN D2) 1,250 mcg (50,000 unit) capsule Take 1 capsule (50,000 Units total) by mouth once a week for 6 doses. 6 capsule 0  . gabapentin  (NEURONTIN ) 300 mg capsule Take 600 mg by mouth every evening.    . meloxicam (MOBIC) 15 mg tablet Take 1 tablet (15 mg total) by mouth daily. May start the day after finishing prednisone  taper. (Patient taking differently: Take 15 mg by mouth daily. May start the day after finishing prednisone  taper. On hold for surgery  02/26/24) 30 tablet 2  . omeprazole (PriLOSEC) 40 mg DR capsule Take 40 mg by mouth daily as needed (GERD).    . pramipexole  (MIRAPEX ) 1.5 mg tablet Take 1 to 3 tablets by mouth every night. Keep next appointment for further refills. (Patient taking differently: Take 1.5 mg by mouth nightly. Take 1 to 3 tablets by mouth every night. Keep next appointment for further refills.) 270 tablet 0  . rosuvastatin (CRESTOR) 5 mg tablet Take 1 tablet (5 mg total) by mouth daily. MUST SCHEDULE  APPOINTMENT FOR FURTHER REFILLS. (Patient taking differently: Take 5 mg by mouth nightly. MUST SCHEDULE APPOINTMENT FOR FURTHER REFILLS.) 60 tablet 0  . sennosides-docusate sodium (PERICOLACE) 8.6-50 mg per tablet Take 2 tablets by mouth 2 (two) times a day as needed for constipation. 100 tablet 0  . vitamins A,C,E-zinc -copper (I-VITE PROTECT) 2,148 mcg-113 mg-45 mg-17.4mg  tab Take 1 tablet by mouth daily. On hold for surgery 02/26/24    . zoledronic  acid-mannitoL-water (RECLAST ) 5 mg/100 mL pgbk Infuse 5 mg into a venous catheter once Indications: osteoporosis in postmenopausal woman at high risk for fracture. Last dose 11/2023 (yearly)     No facility-administered medications prior to visit.  [3] Past Medical History: Diagnosis Date  . Abnormal Pap smear of cervix   . 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 Cataract   . Colon polyp   . Deep vein thrombosis    (CMD)    post back surgery on Eliquis  for 3-4 months 2023  . Osteoporosis   [4] Past Surgical History: Procedure Laterality Date  . BACK SURGERY  2023   Procedure: BACK SURGERY lumbar fusion; ruptured disc  . HEEL SPUR SURGERY     Procedure: HEEL SPUR SURGERY  . RADIOFREQUENCY ABLATION Left 01/06/2021   Procedure: RADIOFREQUENCY ABLATION CERVICAL FACET JOINT - Left C2-C4 #1/1;  Surgeon: Toribio Fairy Badder, MD;  Location: [facility];  Service: Pain Services;  Laterality: Left;  . TOTAL HIP ARTHROPLASTY Left 02/26/2024   ARTHROPLASTY HIP TOTAL  ANTERIOR performed by Vinie Carlin Fonder, MD at [facility] OR  . TUBAL LIGATION    . VAGINOPLASTY  2005   Procedure: VAGINOPLASTY  [5] Family History Problem Relation Name Age of Onset  . Ovarian cancer Mother  24  . PONV Mother    . Glaucoma Paternal Grandmother    . Macular degeneration Neg Hx    . Breast cancer Neg Hx    . Clotting disorder Neg Hx

## 2024-04-17 ENCOUNTER — Ambulatory Visit: Admitting: Cardiology

## 2024-04-18 ENCOUNTER — Other Ambulatory Visit (HOSPITAL_COMMUNITY): Payer: Self-pay

## 2024-04-18 ENCOUNTER — Ambulatory Visit: Attending: Cardiology | Admitting: Cardiology

## 2024-04-18 ENCOUNTER — Encounter: Payer: Self-pay | Admitting: Cardiology

## 2024-04-18 VITALS — BP 120/60 | HR 81 | Ht 61.0 in | Wt 161.0 lb

## 2024-04-18 DIAGNOSIS — R072 Precordial pain: Secondary | ICD-10-CM | POA: Insufficient documentation

## 2024-04-18 DIAGNOSIS — R0609 Other forms of dyspnea: Secondary | ICD-10-CM | POA: Diagnosis not present

## 2024-04-18 DIAGNOSIS — E782 Mixed hyperlipidemia: Secondary | ICD-10-CM | POA: Insufficient documentation

## 2024-04-18 MED ORDER — METOPROLOL TARTRATE 100 MG PO TABS
100.0000 mg | ORAL_TABLET | Freq: Once | ORAL | 0 refills | Status: AC
  Filled 2024-04-18: qty 1, 1d supply, fill #0

## 2024-04-18 NOTE — Progress Notes (Signed)
 Cardiology Office Note:  .   Date:  04/18/2024  ID:  [name], DOB March 20, 1951, MRN [id number] PCP: Elliot Charm, MD  [facility] Providers Cardiologist:  Newman Lawrence, MD PCP: Elliot Charm, MD  Chief Complaint  Patient presents with   Hyperlipidemia   Heartburn     Bonnie Robinson is a 73 y.o. female with hyperlipidemia, heartburn, dyspnea on exertion  Discussed the use of AI scribe software for clinical note transcription with the patient, who gave verbal consent to proceed.  History of Present Illness Bonnie Robinson is a 73 year old female who presents with fainting episodes and heartburn. She was referred by a physician after an ER visit for fainting.  Earlier this year, after her husband passed away, she had a faint episode that led to an ER visit where she received IV fluids. She has had no recurrent fainting, presyncope, chest pain, palpitations, or neurologic symptoms since then.  She has new nocturnal heartburn beginning a few months ago, described as a burning sensation occurring only when she lies down to sleep. Episodes last minutes up to about an hour. They are not consistently food related, though she did note an episode after stuffed peppers last night. Symptoms improve with Tums. She had no prior heartburn before this onset.  She recently had hip surgery and is recovering well. She is ambulating at home and climbing stairs. She sometimes feels short of breath when carrying items up the stairs but denies exertional chest pain or heartburn.  She has hyperlipidemia treated with rosuvastatin 5 mg daily. She took aspirin  325 mg after surgery but is no longer taking it. She denies hypertension or other significant medical conditions.  Her father had congestive heart failure in his seventies. She is retired, lives alone, is very active, does not smoke, and drinks wine a few times per week.      Vitals:   04/18/24 1032  BP: 120/60       Review of Systems  Cardiovascular:  Positive for dyspnea on exertion. Negative for chest pain, leg swelling, palpitations and syncope.  Gastrointestinal:  Positive for heartburn.        Studies Reviewed: SABRA       EKG 04/18/2024: Normal sinus rhythm Normal ECG When compared with ECG of 27-Aug-2023 08:54, No significant change was found    Labs 02/2024: HbA1C 5.5% Hb 13.4 Cr 0.62  08/2023: Cr 0.32 Trop HS 3, 3  06/2023: Chol 202, TG 70, HDL 89 LDL 100  06/2022: Chol 246, TG 66, HDL 85, LDL 131   Physical Exam Vitals and nursing note reviewed.  Constitutional:      General: She is not in acute distress. Neck:     Vascular: No JVD.  Cardiovascular:     Rate and Rhythm: Normal rate and regular rhythm.     Heart sounds: Normal heart sounds. No murmur heard. Pulmonary:     Effort: Pulmonary effort is normal.     Breath sounds: Normal breath sounds. No wheezing or rales.  Musculoskeletal:     Right lower leg: No edema.     Left lower leg: No edema.      VISIT DIAGNOSES:   ICD-10-CM   1. Retrosternal pain  R07.2 EKG 12-Lead    CT CORONARY MORPH W/CTA COR W/SCORE W/CA W/CM &/OR WO/CM    2. DOE (dyspnea on exertion)  R06.09 CT CORONARY MORPH W/CTA COR W/SCORE W/CA W/CM &/OR WO/CM    ECHOCARDIOGRAM COMPLETE  Basic metabolic panel with GFR    3. Mixed hyperlipidemia  E78.2 CT CORONARY MORPH W/CTA COR W/SCORE W/CA W/CM &/OR WO/CM    Basic metabolic panel with GFR       Bonnie Robinson is a 73 y.o. female with hyperlipidemia, heartburn, dyspnea on exertion Assessment & Plan Heartburn: Intermittent nocturnal heartburn relieved by Tums.  Surprisingly, there is no correlation with meals.  She has risk factors for CAD including hyperlipidemia.  Will obtain coronary CT angiogram to exclude coronary artery disease. If obstructive coronary artery disease excluded, recommend follow-up with PCP and consider GI evaluation.  Exertional dyspnea: Physical exam  unremarkable.  Will obtain echocardiogram.  Mixed hyperlipidemia: LDL 100 on Crestor 5 mg daily.  Will assess presence or absence of coronary artery disease to further decide on lipid-lowering therapy.  Hypercholesterolemia Managed with rosuvastatin 5 mg. Coronary CT angiogram will determine need for more aggressive cholesterol management. - Ordered coronary CT angiogram to assess for coronary artery disease and calcium deposition. - Will evaluate need for more aggressive cholesterol management based on CT angiogram results.    Meds ordered this encounter  Medications   metoprolol tartrate (LOPRESSOR) 100 MG tablet    Sig: Take 1 tablet (100 mg total) by mouth once for 1 dose. Take 90-120 minutes prior to scan. Hold for SBP less than 110.    Dispense:  1 tablet    Refill:  0     F/u as needed, unless significant abnormality found on above testing.  Signed, Newman JINNY Lawrence, MD

## 2024-04-18 NOTE — Patient Instructions (Addendum)
 Medication Instructions:  STOP Aspirin    ON DAY OF THE CT:  metoprolol tartrate (LOPRESSOR) 100 MG tablet         Take 1 tablet (100 mg total) by mouth once for 1 dose. Take 90-120 minutes prior to scan. Hold for SBP less than 110.     *If you need a refill on your cardiac medications before your next appointment, please call your pharmacy*  Lab Work: BMP  If you have labs (blood work) drawn today and your tests are completely normal, you will receive your results only by: MyChart Message (if you have MyChart) OR A paper copy in the mail If you have any lab test that is abnormal or we need to change your treatment, we will call you to review the results.  Testing/Procedures: ECHOCARDIOGRAM   Your physician has requested that you have an echocardiogram. Echocardiography is a painless test that uses sound waves to create images of your heart. It provides your doctor with information about the size and shape of your heart and how well your heart's chambers and valves are working. This procedure takes approximately one hour. There are no restrictions for this procedure. Please do NOT wear cologne, perfume, aftershave, or lotions (deodorant is allowed). Please arrive 15 minutes prior to your appointment time.  Please note: We ask at that you not bring children with you during ultrasound (echo/ vascular) testing. Due to room size and safety concerns, children are not allowed in the ultrasound rooms during exams. Our front office staff cannot provide observation of children in our lobby area while testing is being conducted. An adult accompanying a patient to their appointment will only be allowed in the ultrasound room at the discretion of the ultrasound technician under special circumstances. We apologize for any inconvenience.  CORONARY CTA    Your cardiac CT will be scheduled at one of the below locations:   [facility] [address]  [place], [place]  [address]  If scheduled at the [facility] at [address], please enter the parking lot using the [address] entrance and use the FREE valet service at the patient drop-off area. Enter the building and check-in with registration on the main floor.   Please follow these instructions carefully (unless otherwise directed):  An IV will be required for this test and Nitroglycerin will be given.  Hold all erectile dysfunction medications at least 3 days (72 hrs) prior to test. (Ie viagra, cialis, sildenafil, tadalafil, etc)   On the Night Before the Test: Be sure to Drink plenty of water. Do not consume any caffeinated/decaffeinated beverages or chocolate 12 hours prior to your test. Do not take any antihistamines 12 hours prior to your test.  On the Day of the Test: Drink plenty of water until 1 hour prior to the test. Do not eat any food 1 hour prior to test. You may take your regular medications prior to the test.  Take metoprolol (Lopressor) two hours prior to test. If you take Furosemide/Hydrochlorothiazide/Spironolactone/Chlorthalidone, please HOLD on the morning of the test. Patients who wear a continuous glucose monitor MUST remove the device prior to scanning. FEMALES- please wear underwire-free bra if available, avoid dresses & tight clothing       After the Test: Drink plenty of water. After receiving IV contrast, you may experience a mild flushed feeling. This is normal. On occasion, you may experience a mild rash up to 24 hours after the test. This is not  dangerous. If this occurs, you can take Benadryl 25 mg, Zyrtec, Claritin, or Allegra and increase your fluid intake. (Patients taking Tikosyn should avoid Benadryl, and may take Zyrtec, Claritin, or Allegra) If you experience trouble breathing, this can be serious. If it is severe call 911 IMMEDIATELY. If it is mild, please call our office.  We will call to schedule your test 2-4 weeks out understanding that  some insurance companies will need an authorization prior to the service being performed.   For more information and frequently asked questions, please visit our website : [email or web address]  For non-scheduling related questions, please contact the cardiac imaging nurse navigator should you have any questions/concerns: Cardiac Imaging Nurse Navigators Direct Office Dial: [phone]   For scheduling needs, including cancellations and rescheduling, please call Brittany, [phone].   Follow-Up: At [facility], you and your health needs are our priority.  As part of our continuing mission to provide you with exceptional heart care, our providers are all part of one team.  This team includes your primary Cardiologist (physician) and Advanced Practice Providers or APPs (Physician Assistants and Nurse Practitioners) who all work together to provide you with the care you need, when you need it.  Your next appointment:   AS NEEDED  Provider:   Newman JINNY Lawrence, MD

## 2024-04-19 ENCOUNTER — Ambulatory Visit: Payer: Self-pay | Admitting: Cardiology

## 2024-04-19 LAB — BASIC METABOLIC PANEL WITH GFR
BUN/Creatinine Ratio: 24 (ref 12–28)
BUN: 15 mg/dL (ref 8–27)
CO2: 25 mmol/L (ref 20–29)
Calcium: 9.3 mg/dL (ref 8.7–10.3)
Chloride: 100 mmol/L (ref 96–106)
Creatinine, Ser: 0.62 mg/dL (ref 0.57–1.00)
Glucose: 86 mg/dL (ref 70–99)
Potassium: 4.7 mmol/L (ref 3.5–5.2)
Sodium: 138 mmol/L (ref 134–144)
eGFR: 96 mL/min/1.73 (ref >59)

## 2024-05-06 ENCOUNTER — Encounter (HOSPITAL_COMMUNITY): Payer: Self-pay

## 2024-05-08 ENCOUNTER — Ambulatory Visit (HOSPITAL_COMMUNITY)
Admission: RE | Admit: 2024-05-08 | Discharge: 2024-05-08 | Disposition: A | Discharge status: Home / Self Care | Source: Ambulatory Visit | Attending: Cardiovascular Disease | Admitting: Cardiovascular Disease

## 2024-05-08 DIAGNOSIS — R072 Precordial pain: Secondary | ICD-10-CM | POA: Insufficient documentation

## 2024-05-08 DIAGNOSIS — R0609 Other forms of dyspnea: Secondary | ICD-10-CM | POA: Diagnosis not present

## 2024-05-08 DIAGNOSIS — E782 Mixed hyperlipidemia: Secondary | ICD-10-CM | POA: Insufficient documentation

## 2024-05-08 MED ORDER — IOHEXOL 350 MG/ML SOLN
100.0000 mL | Freq: Once | INTRAVENOUS | Status: AC | PRN
  Administered 2024-05-08: 100 mL via INTRAVENOUS

## 2024-05-08 MED ORDER — NITROGLYCERIN 0.4 MG SL SUBL
0.8000 mg | SUBLINGUAL_TABLET | Freq: Once | SUBLINGUAL | Status: AC
  Administered 2024-05-08: 0.8 mg via SUBLINGUAL

## 2024-05-15 MED ORDER — ROSUVASTATIN CALCIUM 20 MG PO TABS: 20.0000 mg | ORAL_TABLET | Freq: Every day | ORAL | 0 refills | Status: AC

## 2024-05-30 ENCOUNTER — Ambulatory Visit (HOSPITAL_COMMUNITY)
Admission: RE | Admit: 2024-05-30 | Discharge: 2024-05-30 | Disposition: A | Discharge status: Home / Self Care | Source: Ambulatory Visit | Attending: Internal Medicine | Admitting: Internal Medicine

## 2024-05-30 DIAGNOSIS — R0609 Other forms of dyspnea: Secondary | ICD-10-CM | POA: Insufficient documentation

## 2024-05-30 LAB — ECHOCARDIOGRAM COMPLETE
Area-P 1/2: 4.52 cm2
P 1/2 time: 397 ms
S' Lateral: 2.7 cm

## 2024-06-21 NOTE — Progress Notes (Unsigned)
 "  NEUROLOGY FOLLOW UP OFFICE NOTE  Bonnie Robinson [id number]  Subjective:  Bonnie Robinson is a 74 y.o. year old  right-handed female with a medical history of HLD, GERD, lumbar spine surgery (08/2021), DVT who we last saw on 12/22/23 for restless leg syndrome.  To briefly review: 08/24/23: Patient's symptoms started when she was 74 years old. She describes a restless sensation, mostly in her legs. Moving helps or concentrating on something helps the most. She does crafts all the time to help with symptoms. Symptoms start in the afternoon. If she starts just sitting, symptoms will prevent her for sitting still. Her mother had RLS and would occasionally give her Sinemet. That would help.   She was prescribed pramipexole  in the 1990s. It started at a low dose and is currently 4.5 mg daily, having been increased over the years. She had a sleep study back then. She only slept a few hours and was able to go home in the middle of the night due to clear results. Patient also has been taking levodopa-carbidopa 10-100 mg for many years.   Patient takes pramipexole  4.5 mg at 5 pm in the evening. She used to take this later in the past.    Patient will take levodopa-carbidopa only when needed, maybe 4-5 times per month.   She takes iron (natural beet iron).   Patient is overall doing okay currently, but can have bad nights. She denies tremors, freezing, imbalance, or falls. She will occasionally have abnormal movement of head which she attributes to her neck.   Patient is needing to take the medication at higher and higher doses and earlier at night. She does not think it is spreading to new parts of her body. It is only rarely in her arms, but been like this since she was 21.   She endorses poor sleep. She goes to bed around 11p-12a. She will get up at 3-5am. She endorses limb movements in her sleep. She may snore occasionally. She thinks she is well rested when she wakes up. She is not tired throughout the  day. She does drink a lot of caffeine though.   Patient was asking PCP about a sleep specialist. She is going to see someone in [place] who told her that he could cure it.   Patient has been on gabapentin  in the past due to pain in back and legs. She does not know if this changes RLS symptoms. She does not remember any side effects.   She has numbness and tingling in her legs, which she states started last year (about 1 year ago). She also has tingling in her neck (due to cervical spine issues per patient).   She does not report any constitutional symptoms like fever, night sweats, anorexia or unintentional weight loss.   EtOH use: 2-3 glasses of wine once a week  Restrictive diet? Intermittent fasting, but good variety Family history of neuropathy/myopathy/neurologic disease? Mother with RLS, other daughter and son have RLS   She may have had an EMG in the past (sound familiar to her) but is not sure and would not know the results.  I started gabapentin  300 mg at bedtime and reduced pramipexole  to 3.0 mg at bedtime and stopped Sinemet on 08/24/23.  12/22/23: Labs, including ferritin were normal.    Patient feels like her RLS is a bit better. She has cut her pramipexole  to 1 tablet of 1.5 mg in the evening over the last week. Her sleep doctor increased  her gabapentin  to 600 mg at bedtime. She had a sleep study that showed no significant abnormalities. She has only taken Sinemet 5-6 times since I last saw her.   She still has tingling in her feet.  Most recent Assessment and Plan (12/22/23): This is Bonnie Robinson, a 74 y.o. female with restless leg syndrome c/b augmentation from pramipexole  and Sinemet. She has tingling in her legs and arms, likely 2/2 to known spine disease. Overall, she is improved from prior having weaned down pramipexole  and Sinemet and increasing gabapentin .    Plan: -Continue gabapentin  600 mg at bedtime -Continue pramipexole  1.5 mg at bedtime. Cut pramipexole  0.75 mg  (1/2 tablet) in about 3 months. -Try not to use Sinemet if able -Will speak to sleep medicine about her insomnia  Since their last visit: ***Dislocation of left hip on 05/2024?***  MEDICATIONS:  Outpatient Encounter Medications as of 07/03/2024  Medication Sig   acetaminophen  (TYLENOL ) 650 MG CR tablet Take 1,300 mg by mouth every 8 (eight) hours as needed for pain.   CALCIUM -VITAMIN D PO Take 1 tablet by mouth in the morning and at bedtime.   carbidopa-levodopa (PARCOPA) 10-100 MG disintegrating tablet Take 2 tablets by mouth daily as needed (restless leg). 2 as needed   gabapentin  (NEURONTIN ) 300 MG capsule Take 1 capsule (300 mg total) by mouth at bedtime.   Menthol , Topical Analgesic, (ICY HOT BACK EX) Apply 1 application. topically 2 (two) times daily as needed (pain).   Menthol -Methyl Salicylate (SALONPAS PAIN RELIEF PATCH EX) Apply 1 patch topically daily as needed (pain).   metoprolol  tartrate (LOPRESSOR ) 100 MG tablet Take 1 tablet (100 mg total) by mouth once for 1 dose. Take 90-120 minutes prior to scan. Hold for SBP less than 110.   Multiple Vitamin (MULTIVITAMIN) tablet Take 1 tablet by mouth daily.   Multiple Vitamins-Minerals (PRESERVISION AREDS 2 PO) Take by mouth.   omeprazole (PRILOSEC) 40 MG capsule Take 40 mg by mouth daily. As needed   pramipexole  (MIRAPEX ) 1.5 MG tablet Take 4.5 mg by mouth every evening.   rosuvastatin  (CRESTOR ) 20 MG tablet Take 1 tablet (20 mg total) by mouth daily.   Zoledronic  Acid (RECLAST  IV) Inject 1 Dose into the vein See admin instructions. Once a year   No facility-administered encounter medications on file as of 07/03/2024.    PAST MEDICAL HISTORY: Past Medical History:  Diagnosis Date   Acute thromboembolism of deep veins of left lower extremity (HCC) 09/17/2021   Age-related osteoporosis without current pathological fracture 02/03/2021   Arthropathy of cervical facet joint 12/17/2020   Added automatically from request for surgery  [id number]     Bilateral occipital neuralgia 11/02/2020   BRCA negative 02/03/2020   Erosive (osteo)arthritis 11/02/2020   Family history of ovarian cancer 02/03/2020   High cholesterol    Hx of iron deficiency 04/29/2020   Neck pain 05/05/2020   Restless leg syndrome    Right arm weakness 02/03/2021   S/P lumbar fusion 08/27/2021   Spondylolisthesis at L4-L5 level 08/23/2021   Spondylosis without myelopathy or radiculopathy, cervical region 02/03/2021   Vitamin B12 deficiency 07/07/2019    PAST SURGICAL HISTORY: Past Surgical History:  Procedure Laterality Date   NECK EXPLORATION      ALLERGIES: Allergies[1]  FAMILY HISTORY: Family History  Problem Relation Age of Onset   Cancer Mother    Heart failure Father     SOCIAL HISTORY: Social History[2] Social History   Social History Narrative   Are you right  handed or left handed? Right   Are you currently employed ?    What is your current occupation? retired   Do you live at home alone? yes   Who lives with you?    What type of home do you live in: 1 story or 2 story? two    Caffiene 5 cups      Objective:  Vital Signs:  There were no vitals taken for this visit.  ***  Labs and Imaging review: New results: External labs (05/16/24): CMP significant for glucose 103 CBC w/ diff unremarkable HbA1c (02/16/24): 5.5 Vit D (01/26/24): low at 25.7  Previously reviewed results: External labs, 12/14/23: Vit D wnl CMP unremarkable   08/24/23: B12: 463 B1 wnl IFE: no M protein Iron studies: ferritin 103   External labs: 07/06/22: CBC w/ diff unremarkable CMP unremarkable TSH wnl Lipid panel: tChol 246, LDL 131, TG 66 Ferritin 44   HbA1c (06/30/21): 5.5 Vit D (02/23/21) wnl B12 (01/07/20): 357   MRI thoracic spine wo contrast (08/28/21): IMPRESSION: 1. Exaggerated thoracic kyphosis with mild multilevel upper thoracic spondylolisthesis T2-T3 through T4-T5. Associated disc and posterior element degeneration at  those levels with borderline to mild spinal stenosis at the latter.   2. Capacious thoracic spinal canal elsewhere. Normal thoracic spinal cord.   3. Lower thoracic facet hypertrophy results in moderate to severe left greater than right T10 neural foraminal stenosis.   MRI cervical spine wo contrast (08/25/21): IMPRESSION: 1. Up to moderate degenerative spinal stenosis and spinal cord mass effect at C4-C5 does not appear significantly changed from the MRI last year, with cord compression but no definite abnormal cord signal. Underlying interbody ankylosis there but endplate spurring and posterior ligamentous hypertrophy contribute to stenosis.   2. Advanced cervical spine degeneration elsewhere, with mild progression of chronic multifactorial spinal stenosis and spinal cord mass at C3-C4, C5-C6. Chronic moderate or severe bilateral C4, right C6 neural foraminal stenosis.   3. Chronic severe C1-C2 joint space loss on the left with ongoing associated marrow edema.   4. 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   MRI lumbar spine wo contrast (08/23/21): IMPRESSION: 1. Grade 1 anterolisthesis of L4 on L5 with associated advanced facet arthropathy and disc bulge resulting in severe spinal canal stenosis with impingement of the cauda equina nerve roots and mild-to-moderate left and mild right neural foraminal stenosis. 2. Subarticular zone narrowing on the right at L1-L2 and L2-L3, bilaterally at L3-L4 (left worse than right), and on the left at L5-S1 with potential irritation of the traversing nerve roots. There is also mild-to-moderate left neural foraminal stenosis at L5-S1. 3. Levoscoliosis centered at L3.  Assessment/Plan:  This is Bonnie Robinson, a 74 y.o. female with: ***   Plan: ***  Return to clinic in ***  Total time spent reviewing records, interview, history/exam, documentation, and coordination of care on day of encounter:  ***  min  Venetia Potters, MD    [1]  Allergies Allergen Reactions   Sulfa Antibiotics Other (See Comments)    Childhood allergy  [2]  Social History Tobacco Use   Smoking status: Never    Passive exposure: Never   Smokeless tobacco: Never  Vaping Use   Vaping status: Never Used  Substance Use Topics   Alcohol use: Yes    Comment: occasionally   Drug use: Never   "

## 2024-07-03 ENCOUNTER — Ambulatory Visit: Admitting: Neurology

## 2024-12-02 ENCOUNTER — Ambulatory Visit: Admitting: Dermatology
